# Patient Record
Sex: Male | Born: 1980 | Race: Black or African American | Hispanic: No | Marital: Single | Smoking: Current every day smoker
Health system: Southern US, Community
[De-identification: ages and names within clinical notes are randomized; demographics above are authoritative.]

## PROBLEM LIST (undated history)

## (undated) DIAGNOSIS — M24411 Recurrent dislocation, right shoulder: Secondary | ICD-10-CM

---

## 2011-09-11 ENCOUNTER — Emergency Department (HOSPITAL_COMMUNITY)
Admission: EM | Admit: 2011-09-11 | Discharge: 2011-09-12 | Disposition: A | Discharge status: Home / Self Care | Payer: Self-pay | Attending: Emergency Medicine | Admitting: Emergency Medicine

## 2011-09-11 ENCOUNTER — Encounter (HOSPITAL_COMMUNITY): Payer: Self-pay | Admitting: Adult Health

## 2011-09-11 DIAGNOSIS — Z202 Contact with and (suspected) exposure to infections with a predominantly sexual mode of transmission: Secondary | ICD-10-CM | POA: Insufficient documentation

## 2011-09-11 DIAGNOSIS — F17200 Nicotine dependence, unspecified, uncomplicated: Secondary | ICD-10-CM | POA: Insufficient documentation

## 2011-09-11 NOTE — ED Notes (Signed)
Would like to be checked out for an STD, partner has one.

## 2011-09-12 MED ORDER — METRONIDAZOLE 500 MG PO TABS: 500.0000 mg | ORAL_TABLET | Freq: Two times a day (BID) | ORAL | Status: AC

## 2011-09-12 MED ORDER — AZITHROMYCIN 250 MG PO TABS
1000.0000 mg | ORAL_TABLET | Freq: Once | ORAL | Status: AC
  Administered 2011-09-12: 1000 mg via ORAL
  Filled 2011-09-12: qty 4

## 2011-09-12 MED ORDER — CEFTRIAXONE SODIUM 250 MG IJ SOLR
250.0000 mg | Freq: Once | INTRAMUSCULAR | Status: AC
  Administered 2011-09-12: 250 mg via INTRAMUSCULAR
  Filled 2011-09-12: qty 250

## 2011-09-12 MED ORDER — LIDOCAINE HCL (PF) 1 % IJ SOLN
INTRAMUSCULAR | Status: AC
  Administered 2011-09-12: 2.1 mL
  Filled 2011-09-12: qty 5

## 2011-09-12 NOTE — ED Notes (Signed)
Pt denies any questions or pain upon discharge. 

## 2011-09-13 LAB — GC/CHLAMYDIA PROBE AMP, URINE: Chlamydia, Swab/Urine, PCR: NEGATIVE

## 2011-09-15 NOTE — ED Provider Notes (Signed)
History     CSN: [id number]  Arrival date & time 09/11/11  2127   First MD Initiated Contact with Patient 09/12/11 0026      Chief Complaint  Patient presents with  . V70.1    (Consider location/radiation/quality/duration/timing/severity/associated sxs/prior treatment) HPI Comments: Pt states male partner was recently txed for trich and he wants to be treated. He does not believe that she had any other STIs. He denies any sx - no penile dc or lesions, no scrotal pain or swelling, no abd pain, no urinary sx.  Patient is a 31 y.o. male presenting with STD exposure. The history is provided by the patient.  Exposure to STD This is a new problem. The current episode started 1 to 4 weeks ago.    History reviewed. No pertinent past medical history.  History reviewed. No pertinent past surgical history.  History reviewed. No pertinent family history.  History  Substance Use Topics  . Smoking status: Current Everyday Smoker  . Smokeless tobacco: Not on file  . Alcohol Use: Yes      Review of Systems per HPI  Allergies  Review of patient's allergies indicates no known allergies.  Home Medications   Current Outpatient Rx  Name Route Sig Dispense Refill  . METRONIDAZOLE 500 MG PO TABS Oral Take 1 tablet (500 mg total) by mouth 2 (two) times daily. 14 tablet 0    BP 129/84  Pulse 78  Temp 98.2 F (36.8 C) (Oral)  Resp 18  SpO2 100%  Physical Exam  Nursing note and vitals reviewed. Constitutional: He appears well-developed and well-nourished. No distress.  HENT:  Head: Normocephalic and atraumatic.  Neck: Normal range of motion.  Cardiovascular: Normal rate.   Pulmonary/Chest: Effort normal.  Musculoskeletal: Normal range of motion.  Neurological: He is alert.  Skin: Skin is warm and dry. He is not diaphoretic.  Psychiatric: He has a normal mood and affect.    ED Course  Procedures (including critical care time)   Labs Reviewed  GC/CHLAMYDIA PROBE AMP,  URINE  LAB REPORT - SCANNED   No results found.   1. Exposure to STD       MDM  Pt with exposure to trich from male partner. He denies any sx with this. Urine gc/chlam sent, prophylactic tx ordered. Rx flagyl. Pt aware of importance of abstinence from etoh while taking this as well as abstaining from intercourse until tx complete. Reasons to return discussed.       Grant Fontana, PA-C 09/15/11 1252

## 2011-09-15 NOTE — ED Provider Notes (Signed)
Medical screening examination/treatment/procedure(s) were performed by non-physician practitioner and as supervising physician I was immediately available for consultation/collaboration.   Charles B. Sheldon, MD 09/15/11 2333 

## 2013-10-17 ENCOUNTER — Emergency Department (HOSPITAL_COMMUNITY)
Admission: EM | Admit: 2013-10-17 | Discharge: 2013-10-17 | Disposition: A | Discharge status: Home / Self Care | Payer: No Typology Code available for payment source | Attending: Emergency Medicine | Admitting: Emergency Medicine

## 2013-10-17 ENCOUNTER — Encounter (HOSPITAL_COMMUNITY): Payer: Self-pay | Admitting: Emergency Medicine

## 2013-10-17 ENCOUNTER — Emergency Department (HOSPITAL_COMMUNITY): Payer: No Typology Code available for payment source

## 2013-10-17 DIAGNOSIS — S161XXA Strain of muscle, fascia and tendon at neck level, initial encounter: Secondary | ICD-10-CM

## 2013-10-17 DIAGNOSIS — Y9389 Activity, other specified: Secondary | ICD-10-CM | POA: Insufficient documentation

## 2013-10-17 DIAGNOSIS — IMO0002 Reserved for concepts with insufficient information to code with codable children: Secondary | ICD-10-CM | POA: Insufficient documentation

## 2013-10-17 DIAGNOSIS — S139XXA Sprain of joints and ligaments of unspecified parts of neck, initial encounter: Secondary | ICD-10-CM | POA: Insufficient documentation

## 2013-10-17 DIAGNOSIS — S0993XA Unspecified injury of face, initial encounter: Secondary | ICD-10-CM | POA: Insufficient documentation

## 2013-10-17 DIAGNOSIS — Y92410 Unspecified street and highway as the place of occurrence of the external cause: Secondary | ICD-10-CM | POA: Insufficient documentation

## 2013-10-17 DIAGNOSIS — F17200 Nicotine dependence, unspecified, uncomplicated: Secondary | ICD-10-CM | POA: Insufficient documentation

## 2013-10-17 DIAGNOSIS — M545 Low back pain, unspecified: Secondary | ICD-10-CM

## 2013-10-17 DIAGNOSIS — S199XXA Unspecified injury of neck, initial encounter: Secondary | ICD-10-CM

## 2013-10-17 MED ORDER — HYDROCODONE-ACETAMINOPHEN 5-325 MG PO TABS: 1.0000 | ORAL_TABLET | Freq: Four times a day (QID) | ORAL | Status: DC | PRN

## 2013-10-17 MED ORDER — NAPROXEN 500 MG PO TABS: 500.0000 mg | ORAL_TABLET | Freq: Two times a day (BID) | ORAL | Status: DC
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
## 2013-10-17 NOTE — ED Notes (Signed)
Pt states he has pain in his neck that shoots across his shoulders, down his spine and down the front of both legs when moving.

## 2013-10-17 NOTE — ED Notes (Signed)
Pt off floor at xray

## 2013-10-18 NOTE — ED Provider Notes (Signed)
Medical screening examination/treatment/procedure(s) were performed by non-physician practitioner and as supervising physician I was immediately available for consultation/collaboration.   EKG Interpretation None        Keylan Costabile N Keiland Pickering, DO 10/18/13 0043 

## 2014-05-17 ENCOUNTER — Emergency Department (HOSPITAL_COMMUNITY)
Admission: EM | Admit: 2014-05-17 | Discharge: 2014-05-17 | Disposition: A | Discharge status: Home / Self Care | Payer: Self-pay | Attending: Emergency Medicine | Admitting: Emergency Medicine

## 2014-05-17 ENCOUNTER — Emergency Department (HOSPITAL_COMMUNITY): Payer: Self-pay

## 2014-05-17 ENCOUNTER — Encounter (HOSPITAL_COMMUNITY): Payer: Self-pay | Admitting: *Deleted

## 2014-05-17 DIAGNOSIS — S4991XA Unspecified injury of right shoulder and upper arm, initial encounter: Secondary | ICD-10-CM | POA: Insufficient documentation

## 2014-05-17 DIAGNOSIS — Y999 Unspecified external cause status: Secondary | ICD-10-CM | POA: Insufficient documentation

## 2014-05-17 DIAGNOSIS — X58XXXA Exposure to other specified factors, initial encounter: Secondary | ICD-10-CM | POA: Insufficient documentation

## 2014-05-17 DIAGNOSIS — Y929 Unspecified place or not applicable: Secondary | ICD-10-CM | POA: Insufficient documentation

## 2014-05-17 DIAGNOSIS — Z720 Tobacco use: Secondary | ICD-10-CM | POA: Insufficient documentation

## 2014-05-17 DIAGNOSIS — Y939 Activity, unspecified: Secondary | ICD-10-CM | POA: Insufficient documentation

## 2014-05-17 DIAGNOSIS — M25511 Pain in right shoulder: Secondary | ICD-10-CM

## 2014-05-17 DIAGNOSIS — Z791 Long term (current) use of non-steroidal anti-inflammatories (NSAID): Secondary | ICD-10-CM | POA: Insufficient documentation

## 2014-05-17 MED ORDER — IBUPROFEN 600 MG PO TABS: 600.0000 mg | ORAL_TABLET | Freq: Four times a day (QID) | ORAL | Status: DC | PRN

## 2014-05-17 MED ORDER — HYDROCODONE-ACETAMINOPHEN 5-325 MG PO TABS: 1.0000 | ORAL_TABLET | Freq: Four times a day (QID) | ORAL | Status: DC | PRN

## 2014-05-17 NOTE — ED Provider Notes (Signed)
CSN: [id number]     Arrival date & time 05/17/14  1319 History  This chart was scribed for non-physician practitioner Roxy Horseman, PA-C working with Richardean Canal, MD by Littie Deeds, ED Scribe. This patient was seen in room TR01C/TR01C and the patient's care was started at 3:32 PM.       Chief Complaint  Patient presents with  . Shoulder Pain   The history is provided by the patient. No language interpreter was used.   HPI Comments: Christopher Mcmahon is a 34 y.o. male who presents to the Emergency Department complaining of gradual onset right shoulder pain occasionally radiating down to his wrist that started a few days after he was moving 2 weeks ago. He states he did not feel a pop while he was moving. But has felt occasional pop since. It is worsened with movement. He has used icy hot for his pain. NKDA.   History reviewed. No pertinent past medical history. History reviewed. No pertinent past surgical history. No family history on file. History  Substance Use Topics  . Smoking status: Current Every Day Smoker  . Smokeless tobacco: Not on file  . Alcohol Use: Yes    Review of Systems  Constitutional: Negative for fever.  Musculoskeletal: Positive for arthralgias.      Allergies  Review of patient's allergies indicates no known allergies.  Home Medications   Prior to Admission medications   Medication Sig Start Date End Date Taking? Authorizing Provider  HYDROcodone-acetaminophen (NORCO) 5-325 MG per tablet Take 1 tablet by mouth every 6 (six) hours as needed for moderate pain. 10/17/13   Hope Orlene Och, NP  naproxen (NAPROSYN) 500 MG tablet Take 1 tablet (500 mg total) by mouth 2 (two) times daily. 10/17/13   Hope Orlene Och, NP   BP 115/87 mmHg  Pulse 86  Temp(Src) 97.6 F (36.4 C) (Oral)  Resp 18  Ht  (1.651 m)  Wt 170 lb (77.111 kg)  BMI 28.29 kg/m2  SpO2 99% Physical Exam  Constitutional: He is oriented to person, place, and time. He appears well-developed and  well-nourished. No distress.  HENT:  Head: Normocephalic and atraumatic.  Mouth/Throat: Oropharynx is clear and moist. No oropharyngeal exudate.  Eyes: Pupils are equal, round, and reactive to light.  Neck: Neck supple.  Cardiovascular: Normal rate and intact distal pulses.   Intact distal pulses, brisk capillary refill  Pulmonary/Chest: Effort normal.  Musculoskeletal: He exhibits no edema.  Negative Hawkins-Kennedy, negative empty can, and tenderness of the acromion, range of motion and strength 5/5  Neurological: He is alert and oriented to person, place, and time. No cranial nerve deficit.  Sensation intact  Skin: Skin is warm and dry. No rash noted.  Psychiatric: He has a normal mood and affect. His behavior is normal.  Nursing note and vitals reviewed.   ED Course  Procedures  DIAGNOSTIC STUDIES: Oxygen Saturation is 99% on room air, normal by my interpretation.    COORDINATION OF CARE: 3:35 PM-Discussed treatment plan which includes medications, shoulder sling and orthopedic follow-up with pt at bedside and pt agreed to plan. Recommended to use ice.   Labs Review Labs Reviewed - No data to display  Imaging Review Dg Shoulder Right  05/17/2014   CLINICAL DATA:  Shoulder pain  EXAM: RIGHT SHOULDER - 2+ VIEW  COMPARISON:  None.  FINDINGS: There is no evidence of fracture or dislocation. There is no evidence of arthropathy or other focal bone abnormality. Soft tissues are unremarkable.  IMPRESSION:  Negative.   Electronically Signed   By: Marlan Palauharles  Clark M.D.   On: 05/17/2014 15:13     EKG Interpretation None      MDM   Final diagnoses:  Shoulder pain, right    Patient with right shoulder injury after moving 2 weeks ago. Reports increased pain with moving and occasionally feels a pop.  Plain films are negative. Discharged home with orthopedic follow-up. Will give shoulder sling and pain medicine.  I personally performed the services described in this documentation,  which was scribed in my presence. The recorded information has been reviewed and is accurate.     Roxy HorsemanRobert Zephan Beauchaine, PA-C 05/17/14 1556  Richardean Canalavid H Yao, MD 05/17/14 [phone]

## 2014-05-17 NOTE — ED Notes (Signed)
Pt tates that he was moving 2 weeks ago. Pt states that he has had progressive rt shoulder pain. Pt states that he hears a pop occasionally. Pt noted to have deformity to rt shoulder. Pt able to move arm, pulse and cap refill wnl.

## 2014-05-17 NOTE — Discharge Instructions (Signed)
Acromioclavicular Injuries °The AC (acromioclavicular) joint is the joint in the shoulder where the collarbone (clavicle) meets the shoulder blade (scapula). The part of the shoulder blade connected to the collarbone is called the acromion. Common problems with and treatments for the AC joint are detailed below. °ARTHRITIS °Arthritis occurs when the joint has been injured and the smooth padding between the joints (cartilage) is lost. This is the wear and tear seen in most joints of the body if they have been overused. This causes the joint to produce pain and swelling which is worse with activity.  °AC JOINT SEPARATION °AC joint separation means that the ligaments connecting the acromion of the shoulder blade and collarbone have been damaged, and the two bones no longer line up. AC separations can be anywhere from mild to severe, and are "graded" depending upon which ligaments are torn and how badly they are torn. °· Grade I Injury: the least damage is done, and the AC joint still lines up. °· Grade II Injury: damage to the ligaments which reinforce the AC joint. In a Grade II injury, these ligaments are stretched but not entirely torn. When stressed, the AC joint becomes painful and unstable. °· Grade III Injury: AC and secondary ligaments are completely torn, and the collarbone is no longer attached to the shoulder blade. This results in deformity; a prominence of the end of the clavicle. °AC JOINT FRACTURE °AC joint fracture means that there has been a break in the bones of the AC joint, usually the end of the clavicle. °TREATMENT °TREATMENT OF AC ARTHRITIS °· There is currently no way to replace the cartilage damaged by arthritis. The best way to improve the condition is to decrease the activities which aggravate the problem. Application of ice to the joint helps decrease pain and soreness (inflammation). The use of non-steroidal anti-inflammatory medication is helpful. °· If less conservative measures do not  work, then cortisone shots (injections) may be used. These are anti-inflammatories; they decrease the soreness in the joint and swelling. °· If non-surgical measures fail, surgery may be recommended. The procedure is generally removal of a portion of the end of the clavicle. This is the part of the collarbone closest to your acromion which is stabilized with ligaments to the acromion of the shoulder blade. This surgery may be performed using a tube-like instrument with a light (arthroscope) for looking into a joint. It may also be performed as an open surgery through a small incision by the surgeon. Most patients will have good range of motion within 6 weeks and may return to all activity including sports by 8-12 weeks, barring complications. °TREATMENT OF AN AC SEPARATION °· The initial treatment is to decrease pain. This is best accomplished by immobilizing the arm in a sling and placing an ice pack to the shoulder for 20 to 30 minutes every 2 hours as needed. As the pain starts to subside, it is important to begin moving the fingers, wrist, elbow and eventually the shoulder in order to prevent a stiff or "frozen" shoulder. Instruction on when and how much to move the shoulder will be provided by your caregiver. The length of time needed to regain full motion and function depends on the amount or grade of the injury. Recovery from a Grade I AC separation usually takes 10 to 14 days, whereas a Grade III may take 6 to 8 weeks. °· Grade I and II separations usually do not require surgery. Even Grade III injuries usually allow return to full   activity with few restrictions. Treatment is also based on the activity demands of the injured shoulder. For example, a high level quarterback with an injured throwing arm will receive more aggressive treatment than someone with a desk job who rarely uses his/her arm for strenuous activities. In some cases, a painful lump may persist which could require a later surgery. Surgery  can be very successful, but the benefits must be weighed against the potential risks. °TREATMENT OF AN AC JOINT FRACTURE °Fracture treatment depends on the type of fracture. Sometimes a splint or sling may be all that is required. Other times surgery may be required for repair. This is more frequently the case when the ligaments supporting the clavicle are completely torn. Your caregiver will help you with these decisions and together you can decide what will be the best treatment. °HOME CARE INSTRUCTIONS  °· Apply ice to the injury for 15-20 minutes each hour while awake for 2 days. Put the ice in a plastic bag and place a towel between the bag of ice and skin. °· If a sling has been applied, wear it constantly for as long as directed by your caregiver, even at night. The sling or splint can be removed for bathing or showering or as directed. Be sure to keep the shoulder in the same place as when the sling is on. Do not lift the arm. °· If a figure-of-eight splint has been applied it should be tightened gently by another person every day. Tighten it enough to keep the shoulders held back. Allow enough room to place the index finger between the body and strap. Loosen the splint immediately if there is numbness or tingling in the hands. °· Take over-the-counter or prescription medicines for pain, discomfort or fever as directed by your caregiver. °· If you or your child has received a follow up appointment, it is very important to keep that appointment in order to avoid long term complications, chronic pain or disability. °SEEK MEDICAL CARE IF:  °· The pain is not relieved with medications. °· There is increased swelling or discoloration that continues to get worse rather than better. °· You or your child has been unable to follow up as instructed. °· There is progressive numbness and tingling in the arm, forearm or hand. °SEEK IMMEDIATE MEDICAL CARE IF:  °· The arm is numb, cold or pale. °· There is increasing pain  in the hand, forearm or fingers. °MAKE SURE YOU:  °· Understand these instructions. °· Will watch your condition. °· Will get help right away if you are not doing well or get worse. °Document Released: 10/24/2004 Document Revised: 04/08/2011 Document Reviewed: 04/18/2008 °[employer]® Patient Information ©2015 [employer]. This information is not intended to replace advice given to you by your health care provider. Make sure you discuss any questions you have with your health care provider. ° °

## 2014-08-29 ENCOUNTER — Encounter (HOSPITAL_COMMUNITY): Payer: Self-pay | Admitting: *Deleted

## 2014-08-29 ENCOUNTER — Emergency Department (HOSPITAL_COMMUNITY)
Admission: EM | Admit: 2014-08-29 | Discharge: 2014-08-30 | Disposition: A | Discharge status: Home / Self Care | Payer: Medicaid Other | Attending: Emergency Medicine | Admitting: Emergency Medicine

## 2014-08-29 ENCOUNTER — Emergency Department (HOSPITAL_COMMUNITY): Payer: Medicaid Other

## 2014-08-29 DIAGNOSIS — Y9289 Other specified places as the place of occurrence of the external cause: Secondary | ICD-10-CM | POA: Insufficient documentation

## 2014-08-29 DIAGNOSIS — X58XXXA Exposure to other specified factors, initial encounter: Secondary | ICD-10-CM | POA: Insufficient documentation

## 2014-08-29 DIAGNOSIS — Z791 Long term (current) use of non-steroidal anti-inflammatories (NSAID): Secondary | ICD-10-CM | POA: Insufficient documentation

## 2014-08-29 DIAGNOSIS — Y998 Other external cause status: Secondary | ICD-10-CM | POA: Insufficient documentation

## 2014-08-29 DIAGNOSIS — M25511 Pain in right shoulder: Secondary | ICD-10-CM

## 2014-08-29 DIAGNOSIS — Y9389 Activity, other specified: Secondary | ICD-10-CM | POA: Diagnosis not present

## 2014-08-29 DIAGNOSIS — S4992XA Unspecified injury of left shoulder and upper arm, initial encounter: Secondary | ICD-10-CM | POA: Diagnosis present

## 2014-08-29 NOTE — ED Notes (Signed)
Pt states that he has been having increasing rt shoulder pain. States that last he was here in May he was told he has some torn ligaments. States that orthopedic dr told him to do physical therapy. Pt wants a new orthopedic referral.

## 2014-08-29 NOTE — ED Notes (Signed)
Pt states that he was prescribed oxycodone and ibuprofen last time in ED and it did not help. States that the orthopedist prescribed tramadol which did not help either.

## 2014-08-30 NOTE — Discharge Instructions (Signed)
Acromioclavicular Injuries °The AC (acromioclavicular) joint is the joint in the shoulder where the collarbone (clavicle) meets the shoulder blade (scapula). The part of the shoulder blade connected to the collarbone is called the acromion. Common problems with and treatments for the AC joint are detailed below. °ARTHRITIS °Arthritis occurs when the joint has been injured and the smooth padding between the joints (cartilage) is lost. This is the wear and tear seen in most joints of the body if they have been overused. This causes the joint to produce pain and swelling which is worse with activity.  °AC JOINT SEPARATION °AC joint separation means that the ligaments connecting the acromion of the shoulder blade and collarbone have been damaged, and the two bones no longer line up. AC separations can be anywhere from mild to severe, and are "graded" depending upon which ligaments are torn and how badly they are torn. °· Grade I Injury: the least damage is done, and the AC joint still lines up. °· Grade II Injury: damage to the ligaments which reinforce the AC joint. In a Grade II injury, these ligaments are stretched but not entirely torn. When stressed, the AC joint becomes painful and unstable. °· Grade III Injury: AC and secondary ligaments are completely torn, and the collarbone is no longer attached to the shoulder blade. This results in deformity; a prominence of the end of the clavicle. °AC JOINT FRACTURE °AC joint fracture means that there has been a break in the bones of the AC joint, usually the end of the clavicle. °TREATMENT °TREATMENT OF AC ARTHRITIS °· There is currently no way to replace the cartilage damaged by arthritis. The best way to improve the condition is to decrease the activities which aggravate the problem. Application of ice to the joint helps decrease pain and soreness (inflammation). The use of non-steroidal anti-inflammatory medication is helpful. °· If less conservative measures do not  work, then cortisone shots (injections) may be used. These are anti-inflammatories; they decrease the soreness in the joint and swelling. °· If non-surgical measures fail, surgery may be recommended. The procedure is generally removal of a portion of the end of the clavicle. This is the part of the collarbone closest to your acromion which is stabilized with ligaments to the acromion of the shoulder blade. This surgery may be performed using a tube-like instrument with a light (arthroscope) for looking into a joint. It may also be performed as an open surgery through a small incision by the surgeon. Most patients will have good range of motion within 6 weeks and may return to all activity including sports by 8-12 weeks, barring complications. °TREATMENT OF AN AC SEPARATION °· The initial treatment is to decrease pain. This is best accomplished by immobilizing the arm in a sling and placing an ice pack to the shoulder for 20 to 30 minutes every 2 hours as needed. As the pain starts to subside, it is important to begin moving the fingers, wrist, elbow and eventually the shoulder in order to prevent a stiff or "frozen" shoulder. Instruction on when and how much to move the shoulder will be provided by your caregiver. The length of time needed to regain full motion and function depends on the amount or grade of the injury. Recovery from a Grade I AC separation usually takes 10 to 14 days, whereas a Grade III may take 6 to 8 weeks. °· Grade I and II separations usually do not require surgery. Even Grade III injuries usually allow return to full   activity with few restrictions. Treatment is also based on the activity demands of the injured shoulder. For example, a high level quarterback with an injured throwing arm will receive more aggressive treatment than someone with a desk job who rarely uses his/her arm for strenuous activities. In some cases, a painful lump may persist which could require a later surgery. Surgery  can be very successful, but the benefits must be weighed against the potential risks. TREATMENT OF AN AC JOINT FRACTURE Fracture treatment depends on the type of fracture. Sometimes a splint or sling may be all that is required. Other times surgery may be required for repair. This is more frequently the case when the ligaments supporting the clavicle are completely torn. Your caregiver will help you with these decisions and together you can decide what will be the best treatment. HOME CARE INSTRUCTIONS   Apply ice to the injury for 15-20 minutes each hour while awake for 2 days. Put the ice in a plastic bag and place a towel between the bag of ice and skin.  Take over-the-counter or prescription medicines for pain, discomfort or fever as directed by your caregiver.  If you or your child has received a follow up appointment, it is very important to keep that appointment in order to avoid long term complications, chronic pain or disability. SEEK MEDICAL CARE IF:   The pain is not relieved with medications.  There is increased swelling or discoloration that continues to get worse rather than better.  You or your child has been unable to follow up as instructed.  There is progressive numbness and tingling in the arm, forearm or hand. SEEK IMMEDIATE MEDICAL CARE IF:   The arm is numb, cold or pale.  There is increasing pain in the hand, forearm or fingers. MAKE SURE YOU:   Understand these instructions.  Will watch your condition.  Will get help right away if you are not doing well or get worse. Document Released: 10/24/2004 Document Revised: 04/08/2011 Document Reviewed: 04/18/2008 [facility] Patient Information 2015 [place], [place]. This information is not intended to replace advice given to you by your health care provider. Make sure you discuss any questions you have with your health care provider.

## 2014-08-30 NOTE — ED Provider Notes (Addendum)
CSN: [id number]     Arrival date & time 08/29/14  2251 History   First MD Initiated Contact with Patient 08/29/14 2304     Chief Complaint  Patient presents with  . Shoulder Pain     (Consider location/radiation/quality/duration/timing/severity/associated sxs/prior Treatment) HPI Comments: 34 year old male presents with right shoulder pain. He states that in April of this year, he was moving furniture and later that day began having right shoulder pain. He was evaluated and diagnosed with a ligamentous injury. He was placed in a sling and discharged with orthopedics follow-up. His wife states that he followed up with orthopedics and they told him he needed physical therapy. They were displeased with this plan and have presented today for referral for second opinion. The patient continues to have significant shoulder pain when he moves his arm above his head. He denies any numbness or tingling of his hand. His wife reports that he often has swelling of the top of his shoulder.  Patient is a 34 y.o. male presenting with shoulder pain. The history is provided by the patient and the spouse.  Shoulder Pain   History reviewed. No pertinent past medical history. History reviewed. No pertinent past surgical history. No family history on file. History  Substance Use Topics  . Smoking status: Current Every Day Smoker  . Smokeless tobacco: Not on file  . Alcohol Use: Yes    Review of Systems 10 Systems reviewed and are negative for acute change except as noted in the HPI.    Allergies  Review of patient's allergies indicates no known allergies.  Home Medications   Prior to Admission medications   Medication Sig Start Date End Date Taking? Authorizing Provider  HYDROcodone-acetaminophen (NORCO/VICODIN) 5-325 MG per tablet Take 1-2 tablets by mouth every 6 (six) hours as needed. 05/17/14   Roxy Horseman, PA-C  ibuprofen (ADVIL,MOTRIN) 600 MG tablet Take 1 tablet (600 mg total) by mouth  every 6 (six) hours as needed. 05/17/14   Roxy Horseman, PA-C  naproxen (NAPROSYN) 500 MG tablet Take 1 tablet (500 mg total) by mouth 2 (two) times daily. 10/17/13   Hope Orlene Och, NP   BP 135/82 mmHg  Pulse 72  Temp(Src) 97.6 F (36.4 C) (Oral)  Resp 16  SpO2 100% Physical Exam  Constitutional: He is oriented to person, place, and time. He appears well-developed and well-nourished. No distress.  HENT:  Head: Normocephalic and atraumatic.  Eyes: Conjunctivae are normal. Pupils are equal, round, and reactive to light.  Cardiovascular: Normal rate, regular rhythm and normal heart sounds.   Pulmonary/Chest: Effort normal and breath sounds normal. No respiratory distress.  Musculoskeletal:  Limited abduction of right shoulder secondary to pain, bony prominence of the distal right clavicle near [facility] joint suggestive of previous dislocation  Neurological: He is alert and oriented to person, place, and time.  Skin: Skin is warm and dry.  Psychiatric: He has a normal mood and affect. His behavior is normal.  Nursing note and vitals reviewed.   ED Course  Procedures (including critical care time) Labs Review Labs Reviewed - No data to display  Imaging Review Dg Shoulder Right  08/29/2014   CLINICAL DATA:  Pain at the right shoulder for 4 months, after lifting furniture. Initial encounter.  EXAM: RIGHT SHOULDER - 2+ VIEW  COMPARISON:  Right shoulder radiographs performed 05/17/2014  FINDINGS: There is no evidence of fracture or dislocation. The right humeral head is seated within the glenoid fossa. Minimal degenerative change is noted at the right  acromioclavicular joint. No significant soft tissue abnormalities are seen. The visualized portions of the right lung are clear.  IMPRESSION: No evidence of fracture or dislocation.   Electronically Signed   By: Roanna Raider M.D.   On: 08/29/2014 23:32     EKG Interpretation None      MDM   Final diagnoses:  Shoulder pain, acute, right    34 year old male with ongoing right shoulder pain after shoulder injury several months ago. They present for a second opinion. No evidence of fracture or dislocation on x-ray. Physical exam consistent with AC joint separation. I have instructed them on supportive care and provided them with contact information for orthopedic follow-up.  Laurence Spates, MD 08/30/14 1610  Laurence Spates, MD 08/30/14 [phone]

## 2014-10-05 ENCOUNTER — Emergency Department (HOSPITAL_COMMUNITY)
Admission: EM | Admit: 2014-10-05 | Discharge: 2014-10-05 | Disposition: A | Discharge status: Home / Self Care | Payer: Medicaid Other | Attending: Emergency Medicine | Admitting: Emergency Medicine

## 2014-10-05 ENCOUNTER — Encounter (HOSPITAL_COMMUNITY): Payer: Self-pay | Admitting: *Deleted

## 2014-10-05 DIAGNOSIS — Y939 Activity, unspecified: Secondary | ICD-10-CM | POA: Insufficient documentation

## 2014-10-05 DIAGNOSIS — Y929 Unspecified place or not applicable: Secondary | ICD-10-CM | POA: Diagnosis not present

## 2014-10-05 DIAGNOSIS — S93402A Sprain of unspecified ligament of left ankle, initial encounter: Secondary | ICD-10-CM | POA: Diagnosis not present

## 2014-10-05 DIAGNOSIS — Z720 Tobacco use: Secondary | ICD-10-CM | POA: Insufficient documentation

## 2014-10-05 DIAGNOSIS — Y998 Other external cause status: Secondary | ICD-10-CM | POA: Insufficient documentation

## 2014-10-05 DIAGNOSIS — S9002XA Contusion of left ankle, initial encounter: Secondary | ICD-10-CM | POA: Diagnosis not present

## 2014-10-05 DIAGNOSIS — S99912A Unspecified injury of left ankle, initial encounter: Secondary | ICD-10-CM | POA: Diagnosis present

## 2014-10-05 DIAGNOSIS — X58XXXA Exposure to other specified factors, initial encounter: Secondary | ICD-10-CM | POA: Insufficient documentation

## 2014-10-05 LAB — CBG MONITORING, ED: Glucose-Capillary: 100 mg/dL — ABNORMAL HIGH (ref 65–99)

## 2014-10-05 MED ORDER — NAPROXEN 375 MG PO TABS: 375.0000 mg | ORAL_TABLET | Freq: Two times a day (BID) | ORAL | Status: DC

## 2014-10-05 MED ORDER — NAPROXEN 250 MG PO TABS
500.0000 mg | ORAL_TABLET | Freq: Once | ORAL | Status: AC
  Administered 2014-10-05: 500 mg via ORAL
  Filled 2014-10-05: qty 2

## 2014-10-05 MED ORDER — NAPROXEN 500 MG PO TABS: 500.0000 mg | ORAL_TABLET | Freq: Two times a day (BID) | ORAL | Status: DC

## 2014-10-05 NOTE — Discharge Instructions (Signed)

## 2014-10-05 NOTE — ED Provider Notes (Signed)
CSN: [id number]     Arrival date & time 10/05/14  1914 History  This chart was scribed for Christopher Pel, PA-C, working with Marily Memos, MD by Elon Spanner, ED Scribe. This patient was seen in room TR08C/TR08C and the patient's care was started at 8:27 PM.   Chief Complaint  Patient presents with  . Edema    The history is provided by the patient. No language interpreter was used.   HPI Comments: Christopher Mcmahon is a 34 y.o. male who presents to the Emergency Department complaining of worsening, moderately painful, bilateral, left > right,  leg swelling with injury onset 6 days ago, improved moderately by elevation/soaking/ace wrap (no other medications tried).  Pt reports he started a new job 8 days ago that requires him to stand the entire time with the exception of short breaks. This is new for him physically.  At this job, he typically wears tennis shoes, ankle socks, and pants or shorts.  Patient is able to ambulate with altered gait due to pain . He denies pain up into his calf, no CP, SOB. Denies hx of blood clots.  History reviewed. No pertinent past medical history. History reviewed. No pertinent past surgical history. History reviewed. No pertinent family history. Social History  Substance Use Topics  . Smoking status: Current Every Day Smoker  . Smokeless tobacco: None  . Alcohol Use: Yes    Review of Systems A complete 10 system review of systems was obtained and all systems are negative except as noted in the HPI and PMH.   Allergies  Review of patient's allergies indicates no known allergies.  Home Medications   Prior to Admission medications   Medication Sig Start Date End Date Taking? Authorizing Provider  HYDROcodone-acetaminophen (NORCO/VICODIN) 5-325 MG per tablet Take 1-2 tablets by mouth every 6 (six) hours as needed. 05/17/14   Roxy Horseman, PA-C  ibuprofen (ADVIL,MOTRIN) 600 MG tablet Take 1 tablet (600 mg total) by mouth every 6 (six) hours as needed.  05/17/14   Roxy Horseman, PA-C  naproxen (NAPROSYN) 375 MG tablet Take 1 tablet (375 mg total) by mouth 2 (two) times daily. 10/05/14   Alzena Gerber Neva Seat, PA-C  naproxen (NAPROSYN) 500 MG tablet Take 1 tablet (500 mg total) by mouth 2 (two) times daily. 10/05/14   Derk Doubek Neva Seat, PA-C   BP 132/97 mmHg  Pulse 96  Temp(Src) 98.4 F (36.9 C) (Oral)  Resp 16  SpO2 99% Physical Exam  Constitutional: He is oriented to person, place, and time. He appears well-developed and well-nourished. No distress.  HENT:  Head: Normocephalic and atraumatic.  Eyes: Conjunctivae and EOM are normal.  Neck: Neck supple. No tracheal deviation present.  Cardiovascular: Normal rate.   Pulmonary/Chest: Effort normal. No respiratory distress.  Musculoskeletal:       Right ankle: Normal. He exhibits no swelling. No tenderness.       Left ankle: He exhibits decreased range of motion (due to pain), swelling (lateral malleoli) and ecchymosis. He exhibits no deformity. Tenderness. Lateral malleolus tenderness found. Achilles tendon normal.       Feet:  Neurological: He is alert and oriented to person, place, and time.  Skin: Skin is warm and dry.  Psychiatric: He has a normal mood and affect. His behavior is normal.  Nursing note and vitals reviewed.   ED Course  Procedures (including critical care time)  DIAGNOSTIC STUDIES: Oxygen Saturation is 99% on RA, normal by my interpretation.    COORDINATION OF CARE:  8:33 PM Patient  advised to use compression hose, anti-inflammatory, and splint.  Patient acknowledges and agrees with plan.  Patient advised of return precautions.    Labs Review Labs Reviewed  CBG MONITORING, ED - Abnormal; Notable for the following:    Glucose-Capillary 100 (*)    All other components within normal limits    Imaging Review No results found. I have personally reviewed and evaluated these images and lab results as part of my medical decision-making.   EKG Interpretation None       MDM   Final diagnoses:  Ankle sprain, left, initial encounter   Patient has ecchymosis and swelling to left lateral ankle. Consistent with a sprain, in his cause most likely due to repetitive use and being on his feet more. Denies trauma. The swelling does not extend past the ankle into the calf or upper leg. No URI symptoms.  Referral to Ortho. Given strict return precautions.  Medications  naproxen (NAPROSYN) tablet 500 mg (not administered)    34 y.o.[name]'s evaluation in the Emergency Department is complete. It has been determined that no acute conditions requiring further emergency intervention are present at this time. The patient/guardian have been advised of the diagnosis and plan. We have discussed signs and symptoms that warrant return to the ED, such as changes or worsening in symptoms.  Vital signs are stable at discharge. Filed Vitals:   10/05/14 1955  BP: 132/97  Pulse: 96  Temp: 98.4 F (36.9 C)  Resp: 16    Patient/guardian has voiced understanding and agreed to follow-up with the PCP or specialist.   I personally performed the services described in this documentation, which was scribed in my presence. The recorded information has been reviewed and is accurate.    Christopher Pel, PA-C 10/05/14 2043  Marily Memos, MD 10/05/14 [phone]

## 2014-10-05 NOTE — ED Notes (Signed)
Pt reports bilateral feet swelling and pain for a week. Pt denies any recent injury to feet. Pt reports working on his feet a lot.

## 2014-10-05 NOTE — ED Notes (Signed)
Patient left at this time with all belongings. 

## 2015-01-26 ENCOUNTER — Emergency Department (HOSPITAL_COMMUNITY): Payer: Medicaid Other

## 2015-01-26 ENCOUNTER — Encounter (HOSPITAL_COMMUNITY): Payer: Self-pay | Admitting: Cardiology

## 2015-01-26 ENCOUNTER — Emergency Department (HOSPITAL_COMMUNITY)
Admission: EM | Admit: 2015-01-26 | Discharge: 2015-01-26 | Disposition: A | Discharge status: Home / Self Care | Payer: Medicaid Other | Attending: Emergency Medicine | Admitting: Emergency Medicine

## 2015-01-26 DIAGNOSIS — R000 Tachycardia, unspecified: Secondary | ICD-10-CM | POA: Insufficient documentation

## 2015-01-26 DIAGNOSIS — J159 Unspecified bacterial pneumonia: Secondary | ICD-10-CM | POA: Diagnosis not present

## 2015-01-26 DIAGNOSIS — F17210 Nicotine dependence, cigarettes, uncomplicated: Secondary | ICD-10-CM | POA: Diagnosis not present

## 2015-01-26 DIAGNOSIS — R0602 Shortness of breath: Secondary | ICD-10-CM | POA: Diagnosis present

## 2015-01-26 DIAGNOSIS — J189 Pneumonia, unspecified organism: Secondary | ICD-10-CM

## 2015-01-26 LAB — COMPREHENSIVE METABOLIC PANEL
ALBUMIN: 4 g/dL (ref 3.5–5.0)
ALT: 28 U/L (ref 17–63)
AST: 21 U/L (ref 15–41)
Alkaline Phosphatase: 99 U/L (ref 38–126)
Anion gap: 9 (ref 5–15)
BILIRUBIN TOTAL: 0.2 mg/dL — AB (ref 0.3–1.2)
BUN: 12 mg/dL (ref 6–20)
CHLORIDE: 105 mmol/L (ref 101–111)
CO2: 24 mmol/L (ref 22–32)
Calcium: 9 mg/dL (ref 8.9–10.3)
Creatinine, Ser: 0.85 mg/dL (ref 0.61–1.24)
GFR calc Af Amer: >60 mL/min (ref >60)
GFR calc non Af Amer: >60 mL/min (ref >60)
GLUCOSE: 101 mg/dL — AB (ref 65–99)
POTASSIUM: 3.8 mmol/L (ref 3.5–5.1)
Sodium: 138 mmol/L (ref 135–145)
Total Protein: 7.3 g/dL (ref 6.5–8.1)

## 2015-01-26 LAB — CBC WITH DIFFERENTIAL/PLATELET
Basophils Absolute: 0 10*3/uL (ref 0.0–0.1)
Basophils Relative: 0 %
Eosinophils Absolute: 0.1 10*3/uL (ref 0.0–0.7)
Eosinophils Relative: 1 %
HEMATOCRIT: 40 % (ref 39.0–52.0)
Hemoglobin: 13.1 g/dL (ref 13.0–17.0)
LYMPHS PCT: 15 %
Lymphs Abs: 2.2 10*3/uL (ref 0.7–4.0)
MCH: 29.8 pg (ref 26.0–34.0)
MCHC: 32.8 g/dL (ref 30.0–36.0)
MCV: 91.1 fL (ref 78.0–100.0)
MONO ABS: 0.8 10*3/uL (ref 0.1–1.0)
MONOS PCT: 6 %
NEUTROS ABS: 11 10*3/uL — AB (ref 1.7–7.7)
Neutrophils Relative %: 78 %
Platelets: 256 10*3/uL (ref 150–400)
RBC: 4.39 MIL/uL (ref 4.22–5.81)
RDW: 13.4 % (ref 11.5–15.5)
WBC: 14.1 10*3/uL — ABNORMAL HIGH (ref 4.0–10.5)

## 2015-01-26 LAB — I-STAT CG4 LACTIC ACID, ED
Lactic Acid, Venous: 1.5 mmol/L (ref 0.5–2.0)
Lactic Acid, Venous: 1.85 mmol/L (ref 0.5–2.0)

## 2015-01-26 MED ORDER — LEVOFLOXACIN IN D5W 750 MG/150ML IV SOLN
750.0000 mg | Freq: Once | INTRAVENOUS | Status: AC
  Administered 2015-01-26: 750 mg via INTRAVENOUS
  Filled 2015-01-26: qty 150

## 2015-01-26 MED ORDER — IBUPROFEN 800 MG PO TABS: 800.0000 mg | ORAL_TABLET | Freq: Three times a day (TID) | ORAL | Status: DC

## 2015-01-26 MED ORDER — ACETAMINOPHEN 325 MG PO TABS
ORAL_TABLET | ORAL | Status: AC
  Filled 2015-01-26: qty 2

## 2015-01-26 MED ORDER — KETOROLAC TROMETHAMINE 30 MG/ML IJ SOLN
30.0000 mg | Freq: Once | INTRAMUSCULAR | Status: AC
  Administered 2015-01-26: 30 mg via INTRAVENOUS
  Filled 2015-01-26: qty 1

## 2015-01-26 MED ORDER — LEVOFLOXACIN 750 MG PO TABS: 750.0000 mg | ORAL_TABLET | Freq: Every day | ORAL | Status: DC

## 2015-01-26 MED ORDER — SODIUM CHLORIDE 0.9 % IV BOLUS (SEPSIS)
1000.0000 mL | Freq: Once | INTRAVENOUS | Status: AC
Start: 2015-01-26 — End: 2015-01-26
  Administered 2015-01-26: 1000 mL via INTRAVENOUS

## 2015-01-26 MED ORDER — ACETAMINOPHEN 500 MG PO TABS
1000.0000 mg | ORAL_TABLET | Freq: Once | ORAL | Status: AC
  Administered 2015-01-26: 1000 mg via ORAL
  Filled 2015-01-26: qty 2

## 2015-01-26 MED ORDER — ACETAMINOPHEN 325 MG PO TABS
650.0000 mg | ORAL_TABLET | Freq: Once | ORAL | Status: AC | PRN
  Administered 2015-01-26: 650 mg via ORAL

## 2015-01-26 NOTE — Discharge Instructions (Signed)
Stay hydrated.   Take tylenol 500 mg every 4 hrs and motrin 800 mg every 6 hrs for fever.   Take levaquin for a week.   See your doctor.   Return to ER if you have fever for a week, vomiting, dehydration, trouble breathing.

## 2015-01-26 NOTE — ED Provider Notes (Signed)
CSN: [id number]     Arrival date & time 01/26/15  1444 History   First MD Initiated Contact with Patient 01/26/15 1909     Chief Complaint  Patient presents with  . Shortness of Breath  . Cough  . Fatigue     (Consider location/radiation/quality/duration/timing/severity/associated sxs/prior Treatment) The history is provided by the patient.  Christopher Mcmahon is a 34 y.o. male here with cough, fever. Patient has been having cough and congestion and fatigue the last several days. States that multiple family members are also sick as well. He's been having productive cough with yellow sputum. Has subjective fever and chills as well. Denies any nausea or vomiting. He is otherwise healthy. He does smoke.    History reviewed. No pertinent past medical history. History reviewed. No pertinent past surgical history. History reviewed. No pertinent family history. Social History  Substance Use Topics  . Smoking status: Current Every Day Smoker    Types: Cigarettes  . Smokeless tobacco: None  . Alcohol Use: Yes    Review of Systems  Constitutional: Positive for fever.  Respiratory: Positive for cough and shortness of breath.   All other systems reviewed and are negative.     Allergies  Review of patient's allergies indicates no known allergies.  Home Medications   Prior to Admission medications   Not on File   BP 122/76 mmHg  Pulse 111  Temp(Src) 100.8 F (38.2 C) (Rectal)  Resp 22  Ht 5\' 5"  (1.651 m)  Wt 165 lb (74.844 kg)  BMI 27.46 kg/m2  SpO2 98% Physical Exam  Constitutional: He is oriented to person, place, and time. He appears well-developed and well-nourished.  HENT:  Head: Normocephalic.  MM slightly dry   Eyes: Conjunctivae are normal. Pupils are equal, round, and reactive to light.  Neck: Normal range of motion. Neck supple.  Cardiovascular: Regular rhythm and normal heart sounds.   Tachycardic   Pulmonary/Chest: Effort normal.  Crackles r base   Abdominal:  Soft. Bowel sounds are normal. He exhibits no distension. There is no tenderness. There is no rebound.  Musculoskeletal: Normal range of motion. He exhibits no edema or tenderness.  Neurological: He is alert and oriented to person, place, and time. No cranial nerve deficit. Coordination normal.  Skin: Skin is warm and dry.  Psychiatric: He has a normal mood and affect. His behavior is normal. Judgment and thought content normal.  Nursing note and vitals reviewed.   ED Course  Procedures (including critical care time) Labs Review Labs Reviewed  COMPREHENSIVE METABOLIC PANEL - Abnormal; Notable for the following:    Glucose, Bld 101 (*)    Total Bilirubin 0.2 (*)    All other components within normal limits  CBC WITH DIFFERENTIAL/PLATELET - Abnormal; Notable for the following:    WBC 14.1 (*)    Neutro Abs 11.0 (*)    All other components within normal limits  I-STAT CG4 LACTIC ACID, ED  I-STAT CG4 LACTIC ACID, ED    Imaging Review Dg Chest 2 View  01/26/2015  CLINICAL DATA:  Initial encounter for productive cough starting yesterday with body aches and chest pain. EXAM: CHEST  2 VIEW COMPARISON:  None. FINDINGS: Two views study shows focal airspace disease with air bronchograms at the left base, suggesting pneumonia. There may be some associated atelectasis in the left lower lung. Streaky subsegmental atelectasis or scarring also noted at the right base. The cardiopericardial silhouette is within normal limits for size. Imaged bony structures of the thorax are  intact. IMPRESSION: Retrocardiac left basilar pneumonia. Given the somewhat atypical appearance and apparent associated volume loss/ atelectasis, followup is recommended to ensure resolution. Electronically Signed   By: Kennith Center M.D.   On: 01/26/2015 16:34   I have personally reviewed and evaluated these images and lab results as part of my medical decision-making.   EKG Interpretation None      MDM   Final diagnoses:   None    Christopher Mcmahon is a 34 y.o. male here with fever, cough. Patient tachy and febrile in the ED. Concerned for sepsis likely from viral vs pneumonia. Will get CBC, CMP, lactate, CXR. Will hydrate and give tylenol, motrin and reassess.   9:50 PM CXR showed pneumonia. Fever improved, tachycardia improved. WBC 14, nl lactate. Given levaquin. Never hypoxic. Doesn't meet criteria for admission for pneumonia. Will dc home with levaquin. Counseled patient regarding tendon ruptures.     Richardean Canal, MD 01/26/15 2151

## 2015-01-26 NOTE — ED Notes (Signed)
Pt reports a cough congestion and fatigue for the past couple of days. States everyone in the home has been sick aslo.

## 2015-05-09 ENCOUNTER — Ambulatory Visit (HOSPITAL_COMMUNITY)
Admission: EM | Admit: 2015-05-09 | Discharge: 2015-05-09 | Disposition: A | Discharge status: Home / Self Care | Payer: Medicaid Other | Attending: Family Medicine | Admitting: Family Medicine

## 2015-05-09 ENCOUNTER — Encounter (HOSPITAL_COMMUNITY): Payer: Self-pay | Admitting: Emergency Medicine

## 2015-05-09 DIAGNOSIS — M25511 Pain in right shoulder: Secondary | ICD-10-CM

## 2015-05-09 MED ORDER — DICLOFENAC SODIUM 50 MG PO TBEC: 50.0000 mg | DELAYED_RELEASE_TABLET | Freq: Two times a day (BID) | ORAL | Status: DC

## 2015-05-09 MED ORDER — METHOCARBAMOL 500 MG PO TABS: 500.0000 mg | ORAL_TABLET | Freq: Four times a day (QID) | ORAL | Status: DC

## 2015-05-09 MED ORDER — IBUPROFEN 800 MG PO TABS: 800.0000 mg | ORAL_TABLET | Freq: Three times a day (TID) | ORAL | Status: DC

## 2015-05-09 NOTE — ED Provider Notes (Signed)
CSN: [id number]     Arrival date & time 05/09/15  1705 History   First MD Initiated Contact with Patient 05/09/15 1840     Chief Complaint  Patient presents with  . Shoulder Pain   (Consider location/radiation/quality/duration/timing/severity/associated sxs/prior Treatment) Patient is a 35 y.o. male presenting with shoulder pain. The history is provided by the patient. No language interpreter was used.  Shoulder Pain Location:  Clavicle and shoulder Time since incident:  12 months Injury: yes   Shoulder location:  R shoulder Pain details:    Quality:  Aching   Radiates to:  Does not radiate   Severity:  Severe   Onset quality:  Gradual   Duration:  12 months   Timing:  Constant Chronicity:  Recurrent Handedness:  Right-handed Dislocation: no   Foreign body present:  No foreign bodies Prior injury to area:  No Relieved by:  Nothing Worsened by:  Nothing tried Ineffective treatments:  None tried Associated symptoms: no back pain     History reviewed. No pertinent past medical history. History reviewed. No pertinent past surgical history. No family history on file. Social History  Substance Use Topics  . Smoking status: Current Every Day Smoker    Types: Cigarettes  . Smokeless tobacco: None  . Alcohol Use: Yes    Review of Systems  Musculoskeletal: Negative for back pain.  All other systems reviewed and are negative.   Allergies  Review of patient's allergies indicates no known allergies.  Home Medications   Prior to Admission medications   Medication Sig Start Date End Date Taking? Authorizing Provider  diclofenac (VOLTAREN) 50 MG EC tablet Take 1 tablet (50 mg total) by mouth 2 (two) times daily. 05/09/15   Elson AreasLeslie K Sofia, PA-C  diclofenac (VOLTAREN) 50 MG EC tablet Take 1 tablet (50 mg total) by mouth 2 (two) times daily. 05/09/15   Elson AreasLeslie K Sofia, PA-C  ibuprofen (ADVIL,MOTRIN) 800 MG tablet Take 1 tablet (800 mg total) by mouth 3 (three) times daily. 05/09/15    Elson AreasLeslie K Sofia, PA-C  levofloxacin (LEVAQUIN) 750 MG tablet Take 1 tablet (750 mg total) by mouth daily. X 7 days 01/26/15   Richardean Canalavid H Yao, MD  methocarbamol (ROBAXIN) 500 MG tablet Take 1 tablet (500 mg total) by mouth 4 (four) times daily. 05/09/15   Elson AreasLeslie K Sofia, PA-C   Meds Ordered and Administered this Visit  Medications - No data to display  BP 128/86 mmHg  Pulse 82  Temp(Src) 97.6 F (36.4 C) (Oral)  SpO2 100% No data found.   Physical Exam  Constitutional: He is oriented to person, place, and time. He appears well-developed and well-nourished.  HENT:  Head: Normocephalic.  Eyes: EOM are normal.  Neck: Normal range of motion.  Pulmonary/Chest: Effort normal.  Abdominal: He exhibits no distension.  Musculoskeletal:  Limit range of motion right shoulder,  Pain with movement.  No deformity nv and ns intact  Neurological: He is alert and oriented to person, place, and time.  Psychiatric: He has a normal mood and affect.  Nursing note and vitals reviewed.   ED Course  Procedures (including critical care time)  Labs Review Labs Reviewed - No data to display  Imaging Review No results found.   Visual Acuity Review  Right Eye Distance:   Left Eye Distance:   Bilateral Distance:    Right Eye Near:   Left Eye Near:    Bilateral Near:         MDM   1.  Shoulder pain, right    Meds ordered this encounter  Medications  . DISCONTD: methocarbamol (ROBAXIN) 500 MG tablet    Sig: Take 1 tablet (500 mg total) by mouth 4 (four) times daily.    Dispense:  20 tablet    Refill:  0    Order Specific Question:  Supervising Provider    Answer:  Linna Hoff [phone]  . diclofenac (VOLTAREN) 50 MG EC tablet    Sig: Take 1 tablet (50 mg total) by mouth 2 (two) times daily.    Dispense:  20 tablet    Refill:  0    Order Specific Question:  Supervising Provider    Answer:  Linna Hoff [phone]  . methocarbamol (ROBAXIN) 500 MG tablet    Sig: Take 1 tablet (500 mg  total) by mouth 4 (four) times daily.    Dispense:  20 tablet    Refill:  0    Order Specific Question:  Supervising Provider    Answer:  Linna Hoff [phone]  . ibuprofen (ADVIL,MOTRIN) 800 MG tablet    Sig: Take 1 tablet (800 mg total) by mouth 3 (three) times daily.    Dispense:  21 tablet    Refill:  0    Order Specific Question:  Supervising Provider    Answer:  Linna Hoff [phone]  . diclofenac (VOLTAREN) 50 MG EC tablet    Sig: Take 1 tablet (50 mg total) by mouth 2 (two) times daily.    Dispense:  20 tablet    Refill:  0    Order Specific Question:  Supervising Provider    Answer:  Linna Hoff [phone]  An After Visit Summary was printed and given to the patient.    Lonia Skinner Preston, PA-C 05/09/15 1933

## 2015-05-09 NOTE — ED Notes (Signed)
See PA notation

## 2015-05-09 NOTE — Discharge Instructions (Signed)
Adhesive Capsulitis Adhesive capsulitis is inflammation of the tendons and ligaments that surround the shoulder joint (shoulder capsule). This condition causes the shoulder to become stiff and painful to move. Adhesive capsulitis is also called frozen shoulder. CAUSES This condition may be caused by:  An injury to the shoulder joint.  Straining the shoulder.  Not moving the shoulder for a period of time. This can happen if your arm was injured or in a sling.  Long-standing health problems, such as:  Diabetes.  Thyroid problems.  Heart disease.  Stroke.  Rheumatoid arthritis.  Lung disease. In some cases, the cause may not be known. RISK FACTORS This condition is more likely to develop in:  Women.  People who are older than 36 years of age. SYMPTOMS Symptoms of this condition include:  Pain in the shoulder when moving the arm. There may also be pain when parts of the shoulder are touched. The pain is worse at night or when at rest.  Soreness or aching in the shoulder.  Inability to move the shoulder normally.  Muscle spasms. DIAGNOSIS This condition is diagnosed with a physical exam and imaging tests, such as an X-ray or MRI. TREATMENT This condition may be treated with:  Treatment of the underlying cause or condition.  Physical therapy. This involves performing exercises to get the shoulder moving again.  Medicine. Medicine may be given to relieve pain, inflammation, or muscle spasms.  Steroid injections into the shoulder joint.  Shoulder manipulation. This is a procedure to move the shoulder into another position. It is done after you are given a medicine to make you fall asleep (general anesthetic). The joint may also be injected with salt water at high pressure to break down scarring.  Surgery. This may be done in severe cases when other treatments have failed. Although most people recover completely from adhesive capsulitis, some may not regain the full  movement of the shoulder. HOME CARE INSTRUCTIONS  Take over-the-counter and prescription medicines only as told by your health care provider.  If you are being treated with physical therapy, follow instructions from your physical therapist.  Avoid exercises that put a lot of demand on your shoulder, such as throwing. These exercises can make pain worse.  If directed, apply ice to the injured area:  Put ice in a plastic bag.  Place a towel between your skin and the bag.  Leave the ice on for 20 minutes, 2-3 times per day. SEEK MEDICAL CARE IF:  You develop new symptoms.  Your symptoms get worse.   This information is not intended to replace advice given to you by your health care provider. Make sure you discuss any questions you have with your health care provider.   Document Released: 11/11/2008 Document Revised: 10/05/2014 Document Reviewed: 05/09/2014 Elsevier Interactive Patient Education 2016 [employer]  

## 2016-01-08 ENCOUNTER — Emergency Department (HOSPITAL_COMMUNITY)
Admission: EM | Admit: 2016-01-08 | Discharge: 2016-01-08 | Disposition: A | Discharge status: Home / Self Care | Payer: Medicaid Other | Attending: Emergency Medicine | Admitting: Emergency Medicine

## 2016-01-08 ENCOUNTER — Encounter (HOSPITAL_COMMUNITY): Payer: Self-pay | Admitting: *Deleted

## 2016-01-08 DIAGNOSIS — Z79899 Other long term (current) drug therapy: Secondary | ICD-10-CM | POA: Insufficient documentation

## 2016-01-08 DIAGNOSIS — J309 Allergic rhinitis, unspecified: Secondary | ICD-10-CM | POA: Insufficient documentation

## 2016-01-08 DIAGNOSIS — H9202 Otalgia, left ear: Secondary | ICD-10-CM | POA: Diagnosis not present

## 2016-01-08 DIAGNOSIS — F17290 Nicotine dependence, other tobacco product, uncomplicated: Secondary | ICD-10-CM | POA: Insufficient documentation

## 2016-01-08 DIAGNOSIS — F17210 Nicotine dependence, cigarettes, uncomplicated: Secondary | ICD-10-CM | POA: Diagnosis not present

## 2016-01-08 DIAGNOSIS — R067 Sneezing: Secondary | ICD-10-CM | POA: Diagnosis present

## 2016-01-08 MED ORDER — CETIRIZINE-PSEUDOEPHEDRINE ER 5-120 MG PO TB12: 1.0000 | ORAL_TABLET | Freq: Every day | ORAL | 0 refills | Status: DC

## 2016-01-08 NOTE — ED Provider Notes (Signed)
[facility]-EMERGENCY DEPT Provider Note   CSN: [id number] Arrival date & time: 01/08/16  1419  By signing my name below, I, Javier Dockerobert Ryan Halas, attest that this documentation has been prepared under the direction and in the presence of Terance HartKelly Adri Schloss, PA-C. Electronically Signed: Javier Dockerobert Ryan Halas, ER Scribe. 09/09/2015. 2:39 PM.  History   Chief Complaint Chief Complaint  Patient presents with  . Otalgia   HPI  HPI Comments: Christopher Mcmahon is a 35 y.o. male who presents to the Emergency Department complaining of intermittent bilateral ear pain for the last month. The left hurts more than the right. He feels like he has water in his ears. He endorses intermittent rhinorrhea, intermittently productive cough, sneezing, and sinus congestion for the last month. He has a PMHx of seasonal allergies but is not taking any allergy medicine. He took an abx pill last night that his mom had left over from when she had a URI but unsure of the name. Denies fever, chills, headache, ear drainage, sore throat, abdominal pain, N/V   History reviewed. No pertinent past medical history.  There are no active problems to display for this patient.   History reviewed. No pertinent surgical history.   Home Medications    Prior to Admission medications   Medication Sig Start Date End Date Taking? Authorizing Provider  diclofenac (VOLTAREN) 50 MG EC tablet Take 1 tablet (50 mg total) by mouth 2 (two) times daily. 05/09/15   Elson AreasLeslie K Sofia, PA-C  diclofenac (VOLTAREN) 50 MG EC tablet Take 1 tablet (50 mg total) by mouth 2 (two) times daily. 05/09/15   Elson AreasLeslie K Sofia, PA-C  ibuprofen (ADVIL,MOTRIN) 800 MG tablet Take 1 tablet (800 mg total) by mouth 3 (three) times daily. 05/09/15   Elson AreasLeslie K Sofia, PA-C  levofloxacin (LEVAQUIN) 750 MG tablet Take 1 tablet (750 mg total) by mouth daily. X 7 days 01/26/15   Charlynne Panderavid Hsienta Yao, MD  methocarbamol (ROBAXIN) 500 MG tablet Take 1 tablet (500 mg total) by mouth 4 (four) times daily.  05/09/15   Elson AreasLeslie K Sofia, PA-C    Family History No family history on file.  Social History Social History  Substance Use Topics  . Smoking status: Current Every Day Smoker    Types: Cigarettes, Cigars  . Smokeless tobacco: Never Used  . Alcohol use Yes     Comment: occ     Allergies   Patient has no known allergies.   Review of Systems Review of Systems  Constitutional: Negative for chills and fever.  HENT: Positive for congestion, ear pain, postnasal drip, rhinorrhea and sneezing. Negative for ear discharge, sinus pressure and sore throat.   Respiratory: Positive for cough. Negative for shortness of breath and wheezing.   Gastrointestinal: Negative for nausea and vomiting.  All other systems reviewed and are negative.    Physical Exam Updated Vital Signs BP 147/91 (BP Location: Right Arm)   Pulse 88   Temp 97.7 F (36.5 C) (Oral)   Resp 18   Wt 165 lb (74.8 kg)   SpO2 100%   BMI 27.46 kg/m   Physical Exam  Constitutional: He is oriented to person, place, and time. He appears well-developed and well-nourished. No distress.  HENT:  Head: Normocephalic and atraumatic.  Right Ear: Hearing, tympanic membrane, external ear and ear canal normal.  Left Ear: Hearing, external ear and ear canal normal. Tympanic membrane is erythematous (mild erythema surrounding TM).  Nose: Mucosal edema and rhinorrhea present. Right sinus exhibits no maxillary sinus tenderness  and no frontal sinus tenderness. Left sinus exhibits no maxillary sinus tenderness and no frontal sinus tenderness.  Eyes: Pupils are equal, round, and reactive to light.  Neck: Neck supple.  Cardiovascular: Normal rate.   Pulmonary/Chest: Effort normal. No respiratory distress.  Musculoskeletal: Normal range of motion.  Neurological: He is alert and oriented to person, place, and time. Coordination normal.  Skin: Skin is warm and dry. He is not diaphoretic.  Psychiatric: He has a normal mood and affect. His  behavior is normal.  Nursing note and vitals reviewed.    ED Treatments / Results  Labs (all labs ordered are listed, but only abnormal results are displayed) Labs Reviewed - No data to display  EKG  EKG Interpretation None       Radiology No results found.  Procedures Procedures (including critical care time)  Medications Ordered in ED Medications - No data to display   Initial Impression / Assessment and Plan / ED Course  I have reviewed the triage vital signs and the nursing notes.  Pertinent labs & imaging results that were available during my care of the patient were reviewed by me and considered in my medical decision making (see chart for details).  Clinical Course    35 year old male presents with symptoms consistent with allergies. Will try H2 blocker with decongestant. Abx not indicated. He is afebrile and non-tachycardic. Patient is NAD, non-toxic. Patient is informed of clinical course, understands medical decision making process, and agrees with plan. Opportunity for questions provided and all questions answered. Return precautions given.   Final Clinical Impressions(s) / ED Diagnoses   Final diagnoses:  Otalgia of left ear  Acute allergic rhinitis, unspecified seasonality, unspecified trigger    New Prescriptions Discharge Medication List as of 01/08/2016  2:42 PM      I personally performed the services described in this documentation, which was scribed in my presence. The recorded information has been reviewed and is accurate.        Bethel BornKelly Marie Asher Torpey, PA-C 01/13/16 [phone]    Loren Raceravid Yelverton, MD 01/13/16 [phone]

## 2016-01-08 NOTE — ED Triage Notes (Signed)
PT states L ear pain x 1 month.  He came in today because he's "tired of it".

## 2016-01-08 NOTE — ED Notes (Signed)
Declined W/C at D/C and was escorted to lobby by RN. 

## 2016-05-08 ENCOUNTER — Emergency Department (HOSPITAL_COMMUNITY)
Admission: EM | Admit: 2016-05-08 | Discharge: 2016-05-08 | Payer: Medicaid Other | Attending: Emergency Medicine | Admitting: Emergency Medicine

## 2016-05-08 ENCOUNTER — Encounter (HOSPITAL_COMMUNITY): Payer: Self-pay | Admitting: *Deleted

## 2016-05-08 ENCOUNTER — Emergency Department (HOSPITAL_COMMUNITY): Payer: Medicaid Other

## 2016-05-08 DIAGNOSIS — Z79899 Other long term (current) drug therapy: Secondary | ICD-10-CM | POA: Diagnosis not present

## 2016-05-08 DIAGNOSIS — G8929 Other chronic pain: Secondary | ICD-10-CM | POA: Diagnosis not present

## 2016-05-08 DIAGNOSIS — M25511 Pain in right shoulder: Secondary | ICD-10-CM | POA: Insufficient documentation

## 2016-05-08 DIAGNOSIS — F17210 Nicotine dependence, cigarettes, uncomplicated: Secondary | ICD-10-CM | POA: Insufficient documentation

## 2016-05-08 HISTORY — DX: Recurrent dislocation, right shoulder: M24.411

## 2016-05-08 MED ORDER — METHOCARBAMOL 500 MG PO TABS
500.0000 mg | ORAL_TABLET | Freq: Three times a day (TID) | ORAL | 0 refills | Status: DC
Start: 2016-05-08 — End: 2017-06-02

## 2016-05-08 MED ORDER — DICLOFENAC SODIUM 50 MG PO TBEC: 50.0000 mg | DELAYED_RELEASE_TABLET | Freq: Two times a day (BID) | ORAL | 0 refills | Status: DC

## 2016-05-08 NOTE — Discharge Instructions (Signed)
Patient is medically cleared to go to jail. Begin taking Robaxin and Voltaren for pain and inflammation as directed. Continue range of motion exercises as tolerated. Return to ED for increased pain, additional injury, numbness, weakness, or decreased strength.

## 2016-05-08 NOTE — ED Provider Notes (Signed)
[facility]-EMERGENCY DEPT Provider Note   CSN: [id number] Arrival date & time: 05/08/16  1039  By signing my name below, I, Marnette Burgess Long, attest that this documentation has been prepared under the direction and in the presence of Tyronn Golda, PA-C. Electronically Signed: Marnette Burgess Long, Scribe. 05/08/2016. 11:09 AM.  History   Chief Complaint Chief Complaint  Patient presents with  . Shoulder Injury   The history is provided by the patient and medical records. No language interpreter was used.   HPI Comments:  Christopher Mcmahon is a 36 y.o. male with no pertinent PMHx, who presents to the Emergency Department by way of the Police, complaining of chronic, intermittent, radiating, right sided, 10/10 shoulder pain onset several years ago. He reports today's pain is similar to pain felt in the past. He states this pain originated s/p moving furniture a couple years ago. He followed up with orthopedics at that time but did not follow through with his PT exercises. No new injuries or falls stated. Pt has an associated symptom of right arm pain radiating from his shoulder. He tried Ibuprofen at home with minimal relief of his pain. He additionally notes muscle relaxants he has received in the past minimally help relieve his pain. His pain is intermittent and is exacerbated by exertion, direct palpation, and movement. Pt denies CP, SOB, abdominal pain, nausea, vomiting, numbness, and any other complaints at this time. Pt is a current every day smoker.   Past Medical History:  Diagnosis Date  . Shoulder dislocation, recurrent, right     There are no active problems to display for this patient.  History reviewed. No pertinent surgical history.  Home Medications    Prior to Admission medications   Medication Sig Start Date End Date Taking? Authorizing Provider  cetirizine-pseudoephedrine (ZYRTEC-D) 5-120 MG tablet Take 1 tablet by mouth daily. 01/08/16   Bethel Born, PA-C  diclofenac  (VOLTAREN) 50 MG EC tablet Take 1 tablet (50 mg total) by mouth 2 (two) times daily. 05/08/16   Emmanuela Ghazi, PA-C  ibuprofen (ADVIL,MOTRIN) 800 MG tablet Take 1 tablet (800 mg total) by mouth 3 (three) times daily. 05/09/15   Elson Areas, PA-C  levofloxacin (LEVAQUIN) 750 MG tablet Take 1 tablet (750 mg total) by mouth daily. X 7 days 01/26/15   Charlynne Pander, MD  methocarbamol (ROBAXIN) 500 MG tablet Take 1 tablet (500 mg total) by mouth 3 (three) times daily. 05/08/16   Brinlyn Cena, PA-C    Family History No family history on file.  Social History Social History  Substance Use Topics  . Smoking status: Current Every Day Smoker    Types: Cigarettes, Cigars  . Smokeless tobacco: Never Used  . Alcohol use Yes     Comment: occ     Allergies   Patient has no known allergies.   Review of Systems Review of Systems  Respiratory: Negative for shortness of breath.   Cardiovascular: Negative for chest pain.  Gastrointestinal: Negative for abdominal pain, nausea and vomiting.  Musculoskeletal: Positive for arthralgias and myalgias.  Neurological: Negative for numbness.  All other systems reviewed and are negative.    Physical Exam Updated Vital Signs BP (!) 146/98 (BP Location: Right Arm)   Pulse 65   Temp 98.8 F (37.1 C) (Oral)   Resp 19   Ht  (1.676 m)   Wt 74.8 kg   SpO2 100%   BMI 26.63 kg/m   Physical Exam  Constitutional: He appears well-developed and well-nourished.  No distress.  HENT:  Head: Normocephalic and atraumatic.  Eyes: Conjunctivae and EOM are normal. No scleral icterus.  Neck: Normal range of motion.  Cardiovascular: Normal rate and regular rhythm.   Bilateral Radial pulses 2+  Pulmonary/Chest: Effort normal and breath sounds normal. No respiratory distress.  Abdominal: Soft. Bowel sounds are normal. He exhibits no distension. There is no tenderness.  Musculoskeletal:  Pain with passive abduction of the right shoulder. Good ROM of his  elbow. Normal sensation. No edema or ecchymosis to the area.   Neurological: He is alert.  Skin: No rash noted. He is not diaphoretic.  Psychiatric: He has a normal mood and affect.  Nursing note and vitals reviewed.    ED Treatments / Results  DIAGNOSTIC STUDIES:  Oxygen Saturation is 100% on RA, normal by my interpretation.    COORDINATION OF CARE:  11:07 AM Discussed treatment plan with pt at bedside including XR of right shoulder, muscle relaxant, shoulder immobilizer, and an anti-inflammatory and pt agreed to plan.  Labs (all labs ordered are listed, but only abnormal results are displayed) Labs Reviewed - No data to display  EKG  EKG Interpretation None       Radiology Dg Shoulder Right  Result Date: 05/08/2016 CLINICAL DATA:  Shoulder pain and injury EXAM: RIGHT SHOULDER - 2+ VIEW COMPARISON:  08/29/2014 FINDINGS: There is no evidence of fracture or dislocation. There is no evidence of arthropathy or other focal bone abnormality. Soft tissues are unremarkable. IMPRESSION: Negative. Electronically Signed   By: Marlan Palau M.D.   On: 05/08/2016 11:06    Procedures Procedures (including critical care time)  Medications Ordered in ED Medications - No data to display   Initial Impression / Assessment and Plan / ED Course  I have reviewed the triage vital signs and the nursing notes.  Pertinent labs & imaging results that were available during my care of the patient were reviewed by me and considered in my medical decision making (see chart for details).     Patient's history and symptoms concerning for dislocation vs. Fracture vs. Joint inflammation. Patient X-Ray negative for obvious fracture or dislocation.  Symptoms likely due to joint inflammation, considering he has had this pain for over one year. Pt advised to follow up with orthopedics at earliest convenience. Patient given shoulder immobilizer while in ED, conservative therapy recommended and  discussed,and given anti-inflammatories and muscle relaxer. Patient will be discharged with police & is agreeable with above plan. Deemed medically cleared for jail. Returns precautions discussed. Pt appears safe for discharge.   Final Clinical Impressions(s) / ED Diagnoses   Final diagnoses:  Chronic right shoulder pain    New Prescriptions Discharge Medication List as of 05/08/2016 11:21 AM     I personally performed the services described in this documentation, which was scribed in my presence. The recorded information has been reviewed and is accurate.     Dietrich Pates, PA-C 05/08/16 1149    Laurence Spates, MD 05/09/16 [phone]

## 2016-05-08 NOTE — ED Notes (Signed)
Patient transported to X-ray 

## 2016-05-08 NOTE — ED Triage Notes (Signed)
Pt is being brought in to be medically cleared to go to jail.  Pt was picked up for a parole violation but the jail would not accept him due to right shoulder injury which is chronic.  Pt reports that he dislocated his shoulder a year ago and it has been this way since.

## 2017-04-14 ENCOUNTER — Other Ambulatory Visit: Payer: Self-pay

## 2017-04-14 ENCOUNTER — Emergency Department (HOSPITAL_COMMUNITY): Payer: Medicaid Other

## 2017-04-14 DIAGNOSIS — Z79899 Other long term (current) drug therapy: Secondary | ICD-10-CM | POA: Diagnosis not present

## 2017-04-14 DIAGNOSIS — J069 Acute upper respiratory infection, unspecified: Secondary | ICD-10-CM | POA: Insufficient documentation

## 2017-04-14 DIAGNOSIS — Z791 Long term (current) use of non-steroidal anti-inflammatories (NSAID): Secondary | ICD-10-CM | POA: Insufficient documentation

## 2017-04-14 DIAGNOSIS — F17210 Nicotine dependence, cigarettes, uncomplicated: Secondary | ICD-10-CM | POA: Diagnosis not present

## 2017-04-14 DIAGNOSIS — R05 Cough: Secondary | ICD-10-CM | POA: Diagnosis present

## 2017-04-14 LAB — CBC
HEMATOCRIT: 41.7 % (ref 39.0–52.0)
Hemoglobin: 13.4 g/dL (ref 13.0–17.0)
MCH: 29.4 pg (ref 26.0–34.0)
MCHC: 32.1 g/dL (ref 30.0–36.0)
MCV: 91.4 fL (ref 78.0–100.0)
PLATELETS: 254 10*3/uL (ref 150–400)
RBC: 4.56 MIL/uL (ref 4.22–5.81)
RDW: 14.2 % (ref 11.5–15.5)
WBC: 13.7 10*3/uL — ABNORMAL HIGH (ref 4.0–10.5)

## 2017-04-14 LAB — BASIC METABOLIC PANEL
Anion gap: 9 (ref 5–15)
BUN: 18 mg/dL (ref 6–20)
CHLORIDE: 107 mmol/L (ref 101–111)
CO2: 22 mmol/L (ref 22–32)
CREATININE: 0.77 mg/dL (ref 0.61–1.24)
Calcium: 9.1 mg/dL (ref 8.9–10.3)
GFR calc Af Amer: >60 mL/min (ref >60)
GFR calc non Af Amer: >60 mL/min (ref >60)
GLUCOSE: 104 mg/dL — AB (ref 65–99)
POTASSIUM: 4.1 mmol/L (ref 3.5–5.1)
Sodium: 138 mmol/L (ref 135–145)

## 2017-04-14 LAB — I-STAT TROPONIN, ED: Troponin i, poc: 0 ng/mL (ref 0.00–0.08)

## 2017-04-14 NOTE — ED Triage Notes (Signed)
Pt reports congestion, coughing x 3-4 days, states tried dayquil and nyquil at home w/ no relief. Denies fevers. Pt reports no known sick contacts. Endorses chest pain w/ coughing and sneezing

## 2017-04-15 ENCOUNTER — Emergency Department (HOSPITAL_COMMUNITY)
Admission: EM | Admit: 2017-04-15 | Discharge: 2017-04-15 | Disposition: A | Discharge status: Home / Self Care | Payer: Medicaid Other | Attending: Emergency Medicine | Admitting: Emergency Medicine

## 2017-04-15 DIAGNOSIS — B9789 Other viral agents as the cause of diseases classified elsewhere: Secondary | ICD-10-CM

## 2017-04-15 DIAGNOSIS — J069 Acute upper respiratory infection, unspecified: Secondary | ICD-10-CM

## 2017-04-15 MED ORDER — NAPROXEN 250 MG PO TABS
500.0000 mg | ORAL_TABLET | Freq: Once | ORAL | Status: AC
  Administered 2017-04-15: 500 mg via ORAL
  Filled 2017-04-15: qty 2

## 2017-04-15 MED ORDER — ALBUTEROL SULFATE HFA 108 (90 BASE) MCG/ACT IN AERS: 2.0000 | INHALATION_SPRAY | RESPIRATORY_TRACT | 0 refills | Status: AC | PRN

## 2017-04-15 MED ORDER — NAPROXEN 500 MG PO TABS: 500.0000 mg | ORAL_TABLET | Freq: Two times a day (BID) | ORAL | 0 refills | Status: DC

## 2017-04-15 MED ORDER — ALBUTEROL SULFATE HFA 108 (90 BASE) MCG/ACT IN AERS
2.0000 | INHALATION_SPRAY | Freq: Once | RESPIRATORY_TRACT | Status: AC
  Administered 2017-04-15: 2 via RESPIRATORY_TRACT
  Filled 2017-04-15: qty 6.7

## 2017-04-15 NOTE — ED Provider Notes (Signed)
[facility] Provider Note   CSN: [id number] Arrival date & time: 04/14/17  2036     History   Chief Complaint Chief Complaint  Patient presents with  . Chest Pain  . Cough    HPI Christopher Mcmahon is a 37 y.o. male.  HPI  This is a 37 year old male who presents with 3-4-day history of cough.  Patient reports cough that is occasionally productive of brown sputum.  Denies any fevers or chills.  Does report some rhinorrhea.  He has taken DayQuil and NyQuil with no relief.  He occasionally has some chest discomfort with coughing.  Mother had the flu 3 weeks ago.  He denies any abdominal pain, vomiting, diarrhea.  He is a smoker.  Past Medical History:  Diagnosis Date  . Shoulder dislocation, recurrent, right     There are no active problems to display for this patient.   No past surgical history on file.     Home Medications    Prior to Admission medications   Medication Sig Start Date End Date Taking? Authorizing Provider  albuterol (PROVENTIL HFA;VENTOLIN HFA) 108 (90 Base) MCG/ACT inhaler Inhale 2 puffs into the lungs every 4 (four) hours as needed for wheezing or shortness of breath. 04/15/17   Mahogony Gilchrest, Mayer Maskerourtney F, MD  cetirizine-pseudoephedrine (ZYRTEC-D) 5-120 MG tablet Take 1 tablet by mouth daily. 01/08/16   Bethel BornGekas, Kelly Marie, PA-C  diclofenac (VOLTAREN) 50 MG EC tablet Take 1 tablet (50 mg total) by mouth 2 (two) times daily. 05/08/16   Khatri, Hina, PA-C  ibuprofen (ADVIL,MOTRIN) 800 MG tablet Take 1 tablet (800 mg total) by mouth 3 (three) times daily. 05/09/15   Elson AreasSofia, Leslie K, PA-C  levofloxacin (LEVAQUIN) 750 MG tablet Take 1 tablet (750 mg total) by mouth daily. X 7 days 01/26/15   Charlynne PanderYao, David Hsienta, MD  methocarbamol (ROBAXIN) 500 MG tablet Take 1 tablet (500 mg total) by mouth 3 (three) times daily. 05/08/16   Khatri, Hina, PA-C  naproxen (NAPROSYN) 500 MG tablet Take 1 tablet (500 mg total) by mouth 2 (two) times daily.  04/15/17   Pravin Perezperez, Mayer Maskerourtney F, MD    Family History No family history on file.  Social History Social History   Tobacco Use  . Smoking status: Current Every Day Smoker    Types: Cigarettes, Cigars  . Smokeless tobacco: Never Used  Substance Use Topics  . Alcohol use: Yes    Comment: occ  . Drug use: No     Allergies   Patient has no known allergies.   Review of Systems Review of Systems  Constitutional: Negative for chills and fever.  HENT: Positive for congestion and rhinorrhea.   Respiratory: Positive for cough. Negative for shortness of breath.   Cardiovascular: Positive for chest pain.  Gastrointestinal: Negative for abdominal pain, nausea and vomiting.  Genitourinary: Negative for dysuria.  Neurological: Negative for headaches.  All other systems reviewed and are negative.    Physical Exam Updated Vital Signs BP (!) 136/93   Pulse 68   Temp 97.9 F (36.6 C)   Resp 17   Ht 5\' 10"  (1.778 m)   Wt 77.1 kg (170 lb)   SpO2 100%   BMI 24.39 kg/m   Physical Exam  Constitutional: He is oriented to person, place, and time. He appears well-developed and well-nourished.  HENT:  Head: Normocephalic and atraumatic.  Cardiovascular: Normal rate, regular rhythm and normal heart sounds.  No murmur heard. Chest wall tenderness to palpation without crepitus  Pulmonary/Chest: Effort normal and breath sounds normal. No respiratory distress. He has no wheezes.  Abdominal: Soft. Bowel sounds are normal. There is no tenderness. There is no rebound.  Musculoskeletal: He exhibits no edema.  Neurological: He is alert and oriented to person, place, and time.  Skin: Skin is warm and dry.  Psychiatric: He has a normal mood and affect.  Nursing note and vitals reviewed.    ED Treatments / Results  Labs (all labs ordered are listed, but only abnormal results are displayed) Labs Reviewed  BASIC METABOLIC PANEL - Abnormal; Notable for the following components:      Result  Value   Glucose, Bld 104 (*)    All other components within normal limits  CBC - Abnormal; Notable for the following components:   WBC 13.7 (*)    All other components within normal limits  I-STAT TROPONIN, ED    EKG  EKG Interpretation  Date/Time:  Monday April 14 2017 21:26:44 EDT Ventricular Rate:  76 PR Interval:  128 QRS Duration: 86 QT Interval:  356 QTC Calculation: 400 R Axis:   83 Text Interpretation:  Normal sinus rhythm Biatrial enlargement Left ventricular hypertrophy Abnormal ECG No prior for comparison Confirmed by Ross Marcus ([id number]) on 04/15/2017 3:16:42 AM       Radiology Dg Chest 2 View  Result Date: 04/14/2017 CLINICAL DATA:  Cough and congestion.  Chest pain with cough. EXAM: CHEST - 2 VIEW COMPARISON:  Radiographs 01/26/2015 FINDINGS: Previous retrocardiac opacity has resolved.The cardiomediastinal contours are normal. The lungs are clear. Pulmonary vasculature is normal. No consolidation, pleural effusion, or pneumothorax. No acute osseous abnormalities are seen. IMPRESSION: Unremarkable radiographs of the chest. Electronically Signed   By: Rubye Oaks M.D.   On: 04/14/2017 21:48    Procedures Procedures (including critical care time)  Medications Ordered in ED Medications  naproxen (NAPROSYN) tablet 500 mg (not administered)  albuterol (PROVENTIL HFA;VENTOLIN HFA) 108 (90 Base) MCG/ACT inhaler 2 puff (not administered)     Initial Impression / Assessment and Plan / ED Course  I have reviewed the triage vital signs and the nursing notes.  Pertinent labs & imaging results that were available during my care of the patient were reviewed by me and considered in my medical decision making (see chart for details).     Presents with cough and occasional chest pain.  Nontoxic on exam.  Afebrile.  Vital signs reassuring.  Exam is benign including pulmonary exam.  Chest x-ray shows no evidence of pneumonia.  Lab workup shows a leukocytosis.  This is  nonspecific.  Clinically I have low suspicion for pneumonia.  Suspect viral etiology.  Given smoking, will provide an inhaler.  Patient was given naproxen for pain.  After history, exam, and medical workup I feel the patient has been appropriately medically screened and is safe for discharge home. Pertinent diagnoses were discussed with the patient. Patient was given return precautions.   Final Clinical Impressions(s) / ED Diagnoses   Final diagnoses:  Viral URI with cough    ED Discharge Orders        Ordered    albuterol (PROVENTIL HFA;VENTOLIN HFA) 108 (90 Base) MCG/ACT inhaler  Every 4 hours PRN     04/15/17 0338    naproxen (NAPROSYN) 500 MG tablet  2 times daily     04/15/17 0338       Sahej Hauswirth, Mayer Masker, MD 04/15/17 [id number]

## 2017-06-02 ENCOUNTER — Encounter (HOSPITAL_COMMUNITY): Payer: Self-pay | Admitting: Family Medicine

## 2017-06-02 ENCOUNTER — Ambulatory Visit (INDEPENDENT_AMBULATORY_CARE_PROVIDER_SITE_OTHER): Payer: Medicaid Other

## 2017-06-02 ENCOUNTER — Ambulatory Visit (HOSPITAL_COMMUNITY)
Admission: EM | Admit: 2017-06-02 | Discharge: 2017-06-02 | Disposition: A | Discharge status: Home / Self Care | Payer: Medicaid Other | Attending: Family Medicine | Admitting: Family Medicine

## 2017-06-02 DIAGNOSIS — M25571 Pain in right ankle and joints of right foot: Secondary | ICD-10-CM

## 2017-06-02 DIAGNOSIS — S99919A Unspecified injury of unspecified ankle, initial encounter: Secondary | ICD-10-CM

## 2017-06-02 DIAGNOSIS — S93401A Sprain of unspecified ligament of right ankle, initial encounter: Secondary | ICD-10-CM

## 2017-06-02 MED ORDER — DICLOFENAC SODIUM 75 MG PO TBEC: 75.0000 mg | DELAYED_RELEASE_TABLET | Freq: Two times a day (BID) | ORAL | 0 refills | Status: DC

## 2017-06-02 NOTE — ED Provider Notes (Signed)
[facility]   [id number] 06/02/17 Arrival Time: 1020  ASSESSMENT & PLAN:  1. Ankle injury, initial encounter   2. Sprain of right ankle, unspecified ligament, initial encounter     Imaging: Dg Ankle Complete Right  Result Date: 06/02/2017 CLINICAL DATA:  Christopher Mcmahon last night injuring the right ankle and now has pain with weight-bearing. EXAM: RIGHT ANKLE - COMPLETE 3+ VIEW COMPARISON:  None in PACs FINDINGS: The bones are subjectively adequately mineralized. The joint mortise is preserved. The talar dome is intact. There is no acute malleolar fracture. There is an accessory ossicle versus old avulsion fracture fragment just inferior to the tip of the lateral malleolus. There is mild soft tissue swelling anteriorly and medially. The observed metatarsal bases are intact. IMPRESSION: Mild soft tissue swelling medially without evidence of an acute fracture nor dislocation. Electronically Signed   By: David  [place] M.D.   On: 06/02/2017 12:24   Meds ordered this encounter  Medications  . diclofenac (VOLTAREN) 75 MG EC tablet    Sig: Take 1 tablet (75 mg total) by mouth 2 (two) times daily.    Dispense:  14 tablet    Refill:  0    Natural history and expected course discussed. Questions answered. Rest, ice, compression, elevation (RICE) therapy. Crutches and instructions provided. Transport planner distributed. Fit with ankle brace for use over next 1 week. NSAIDs per medication orders. Follow-up in 1 week. Work note given.  Reviewed expectations re: course of current medical issues. Questions answered. Outlined signs and symptoms indicating need for more acute intervention. Patient verbalized understanding. After Visit Summary given.  SUBJECTIVE: History from: patient. Christopher Mcmahon is a 37 y.o. male who reports persistent moderate pain of his right medial ankle that is stable; described as aching without radiation. Onset: abrupt, last evening. Injury/trama: yes, reports  slipping on water and twisting ankle along with hitting ankle on curb. Relieved by: not moving. Worsened by: certain movements. Associated symptoms: none reported. Extremity sensation changes or weakness: none. Self treatment: has not tried OTCs for relief of pain. History of similar: no  ROS: As per HPI.   OBJECTIVE:  Vitals:   06/02/17 1129  BP: 125/86  Pulse: 76  Resp: 18  Temp: 98.2 F (36.8 C)  SpO2: 97%    General appearance: alert; no distress Extremities: warm and well perfused; symmetrical with no gross deformities; poorly localized tenderness over his right medial ankle with mild swelling and no bruising; ROM: normal but with reported discomfort CV: normal extremity capillary refill Skin: warm and dry Neurologic: normal gait; normal symmetric reflexes in all extremities; normal sensation in all extremities Psychological: alert and cooperative; normal mood and affect  No Known Allergies  Past Medical History:  Diagnosis Date  . Shoulder dislocation, recurrent, right    Social History   Socioeconomic History  . Marital status: Single    Spouse name: Not on file  . Number of children: Not on file  . Years of education: Not on file  . Highest education level: Not on file  Occupational History  . Not on file  Social Needs  . Financial resource strain: Not on file  . Food insecurity:    Worry: Not on file    Inability: Not on file  . Transportation needs:    Medical: Not on file    Non-medical: Not on file  Tobacco Use  . Smoking status: Current Every Day Smoker    Types: Cigarettes, Cigars  . Smokeless tobacco: Never Used  Substance and Sexual Activity  . Alcohol use: Yes    Comment: occ  . Drug use: No  . Sexual activity: Not on file  Lifestyle  . Physical activity:    Days per week: Not on file    Minutes per session: Not on file  . Stress: Not on file  Relationships  . Social connections:    Talks on phone: Not on file    Gets together:  Not on file    Attends religious service: Not on file    Active member of club or organization: Not on file    Attends meetings of clubs or organizations: Not on file    Relationship status: Not on file  . Intimate partner violence:    Fear of current or ex partner: Not on file    Emotionally abused: Not on file    Physically abused: Not on file    Forced sexual activity: Not on file  Other Topics Concern  . Not on file  Social History Narrative  . Not on file   History reviewed. No pertinent surgical history.    Christopher Layman, MD 06/02/17 1301

## 2017-06-02 NOTE — ED Triage Notes (Signed)
Pt here for right ankle pain after slipping in the rain yesterday. Pain with ambulation and swelling.

## 2017-07-30 ENCOUNTER — Other Ambulatory Visit: Payer: Self-pay

## 2017-07-30 DIAGNOSIS — F17210 Nicotine dependence, cigarettes, uncomplicated: Secondary | ICD-10-CM | POA: Insufficient documentation

## 2017-07-30 DIAGNOSIS — M5442 Lumbago with sciatica, left side: Secondary | ICD-10-CM | POA: Diagnosis not present

## 2017-07-30 DIAGNOSIS — M5441 Lumbago with sciatica, right side: Secondary | ICD-10-CM | POA: Insufficient documentation

## 2017-07-30 DIAGNOSIS — F17290 Nicotine dependence, other tobacco product, uncomplicated: Secondary | ICD-10-CM | POA: Diagnosis not present

## 2017-07-30 DIAGNOSIS — M545 Low back pain: Secondary | ICD-10-CM | POA: Diagnosis present

## 2017-07-31 ENCOUNTER — Emergency Department (HOSPITAL_COMMUNITY): Payer: Medicaid Other

## 2017-07-31 ENCOUNTER — Emergency Department (HOSPITAL_COMMUNITY)
Admission: EM | Admit: 2017-07-31 | Discharge: 2017-07-31 | Disposition: A | Discharge status: Home / Self Care | Payer: Medicaid Other | Attending: Emergency Medicine | Admitting: Emergency Medicine

## 2017-07-31 ENCOUNTER — Encounter (HOSPITAL_COMMUNITY): Payer: Self-pay | Admitting: Emergency Medicine

## 2017-07-31 DIAGNOSIS — M5442 Lumbago with sciatica, left side: Secondary | ICD-10-CM

## 2017-07-31 DIAGNOSIS — M5441 Lumbago with sciatica, right side: Secondary | ICD-10-CM

## 2017-07-31 LAB — URINALYSIS, ROUTINE W REFLEX MICROSCOPIC
BILIRUBIN URINE: NEGATIVE
Glucose, UA: 50 mg/dL — AB
Ketones, ur: NEGATIVE mg/dL
LEUKOCYTES UA: NEGATIVE
Nitrite: NEGATIVE
PROTEIN: NEGATIVE mg/dL
Specific Gravity, Urine: 1.017 (ref 1.005–1.030)
pH: 5 (ref 5.0–8.0)

## 2017-07-31 MED ORDER — PREDNISONE 50 MG PO TABS: ORAL_TABLET | ORAL | 0 refills | Status: DC

## 2017-07-31 MED ORDER — HYDROCODONE-ACETAMINOPHEN 5-325 MG PO TABS
2.0000 | ORAL_TABLET | Freq: Once | ORAL | Status: AC
  Administered 2017-07-31: 2 via ORAL
  Filled 2017-07-31: qty 2

## 2017-07-31 MED ORDER — IBUPROFEN 400 MG PO TABS
400.0000 mg | ORAL_TABLET | Freq: Once | ORAL | Status: AC
  Administered 2017-07-31: 400 mg via ORAL
  Filled 2017-07-31: qty 1

## 2017-07-31 MED ORDER — HYDROCODONE-ACETAMINOPHEN 5-325 MG PO TABS: 1.0000 | ORAL_TABLET | Freq: Four times a day (QID) | ORAL | 0 refills | Status: AC | PRN

## 2017-07-31 NOTE — ED Triage Notes (Signed)
Pt reports back pain for three weeks, states he woke up with it. Unsure of heavy lifting injury. No tingling or numbness, denies loss of control of bowel and bladder

## 2017-07-31 NOTE — ED Provider Notes (Signed)
[facility] EMERGENCY DEPARTMENT Provider Note   CSN: [id number] Arrival date & time: 07/30/17  2358     History   Chief Complaint Chief Complaint  Patient presents with  . Back Pain    HPI Christopher Mcmahon is a 37 y.o. male.  The history is provided by the patient and the spouse.  Back Pain   This is a new problem. The current episode started more than 1 week ago. The problem occurs daily. The problem has been gradually worsening. The pain is associated with no known injury. The pain is present in the lumbar spine. The pain radiates to the right thigh and left thigh. The pain is severe. The symptoms are aggravated by certain positions. The pain is the same all the time. Pertinent negatives include no fever, no abdominal pain, no bowel incontinence, no bladder incontinence and no weakness. He has tried NSAIDs for the symptoms. The treatment provided no relief.   Patient reports onset of back pain over 3 weeks ago.  Denies any trauma.  No recent heavy lifting.  Denies any bowel or bladder incontinence.  It hurts to ambulate.  No abdominal pain. Past Medical History:  Diagnosis Date  . Shoulder dislocation, recurrent, right     There are no active problems to display for this patient.   History reviewed. No pertinent surgical history.      Home Medications    Prior to Admission medications   Medication Sig Start Date End Date Taking? Authorizing Provider  albuterol (PROVENTIL HFA;VENTOLIN HFA) 108 (90 Base) MCG/ACT inhaler Inhale 2 puffs into the lungs every 4 (four) hours as needed for wheezing or shortness of breath. 04/15/17   Horton, Mayer Masker, MD  diclofenac (VOLTAREN) 75 MG EC tablet Take 1 tablet (75 mg total) by mouth 2 (two) times daily. 06/02/17   Mardella Layman, MD    Family History History reviewed. No pertinent family history.  Social History Social History   Tobacco Use  . Smoking status: Current Every Day Smoker    Packs/day: 0.00   Types: Cigarettes, Cigars  . Smokeless tobacco: Never Used  Substance Use Topics  . Alcohol use: Yes    Comment: occ  . Drug use: No     Allergies   Patient has no known allergies.   Review of Systems Review of Systems  Constitutional: Negative for fever.  Gastrointestinal: Negative for abdominal pain and bowel incontinence.  Genitourinary: Negative for bladder incontinence and difficulty urinating.  Musculoskeletal: Positive for back pain.  Neurological: Negative for weakness.  All other systems reviewed and are negative.    Physical Exam Updated Vital Signs BP 118/65 (BP Location: Right Arm)   Pulse 87   Temp 98 F (36.7 C)   Resp 18   SpO2 100%   Physical Exam  CONSTITUTIONAL: Well developed/well nourished HEAD: Normocephalic/atraumatic EYES: EOMI/PERRL ENMT: Mucous membranes moist NECK: supple no meningeal signs SPINE/BACK: Lumbar spinal and paraspinal tenderness, no bruising/crepitance/stepoffs noted to spine CV: S1/S2 noted, no murmurs/rubs/gallops noted LUNGS: Lungs are clear to auscultation bilaterally, no apparent distress ABDOMEN: soft, nontender, no rebound or guarding GU:no cva tenderness NEURO: Awake/alert, equal motor 5/5 strength noted with the following: hip flexion/knee flexion/extension, foot dorsi/plantar flexion, great toe extension intact bilaterally, no clonus bilaterally, plantar reflex appropriate (toes downgoing), no sensory deficit in any dermatome.  Equal patellar/achilles reflex noted (2+) in bilateral lower extremities.  Pt is able to ambulate unassisted. EXTREMITIES: pulses normal, full ROM SKIN: warm, color normal PSYCH: no abnormalities  of mood noted, alert and oriented to situation   ED Treatments / Results  Labs (all labs ordered are listed, but only abnormal results are displayed) Labs Reviewed  URINALYSIS, ROUTINE W REFLEX MICROSCOPIC - Abnormal; Notable for the following components:      Result Value   Glucose, UA 50 (*)      Hgb urine dipstick SMALL (*)    Bacteria, UA RARE (*)    All other components within normal limits    EKG None  Radiology Dg Thoracic Spine 2 View  Result Date: 07/31/2017 CLINICAL DATA:  Back pain EXAM: THORACIC SPINE 2 VIEWS COMPARISON:  Chest x-ray 04/14/2017 FINDINGS: There is no evidence of thoracic spine fracture. Alignment is normal. No other significant bone abnormalities are identified. IMPRESSION: Negative. Electronically Signed   By: Jasmine PangKim  Fujinaga M.D.   On: 07/31/2017 00:50   Dg Lumbar Spine 2-3 Views  Result Date: 07/31/2017 CLINICAL DATA:  Back pain EXAM: LUMBAR SPINE - 2-3 VIEW COMPARISON:  None. FINDINGS: There is no evidence of lumbar spine fracture. Alignment is normal. Intervertebral disc spaces are maintained. IMPRESSION: Negative. Electronically Signed   By: Jasmine PangKim  Fujinaga M.D.   On: 07/31/2017 00:50    Procedures Procedures   Medications Ordered in ED Medications  ibuprofen (ADVIL,MOTRIN) tablet 400 mg (400 mg Oral Given 07/31/17 0313)  HYDROcodone-acetaminophen (NORCO/VICODIN) 5-325 MG per tablet 2 tablet (2 tablets Oral Given 07/31/17 0313)  Narcotic database reviewed and considered in decision making   Initial Impression / Assessment and Plan / ED Course  I have reviewed the triage vital signs and the nursing notes.  Pertinent labs & imaging results that were available during my care of the patient were reviewed by me and considered in my medical decision making (see chart for details).     No focal neuro deficits, patient can ambulate. Short course of pain medicines, will give a burst of steroids.  Referred to a PCP as well as neurosurgery.  We discussed strict ER return precautions.  Final Clinical Impressions(s) / ED Diagnoses   Final diagnoses:  Acute midline low back pain with bilateral sciatica    ED Discharge Orders        Ordered    HYDROcodone-acetaminophen (NORCO/VICODIN) 5-325 MG tablet  Every 6 hours PRN     07/31/17 0407    predniSONE  (DELTASONE) 50 MG tablet     07/31/17 0407       Zadie RhineWickline, Micheale Schlack, MD 07/31/17 [id number]

## 2017-07-31 NOTE — Discharge Instructions (Addendum)

## 2017-07-31 NOTE — ED Notes (Signed)
Signature pad not available. Discharge instructions reviewed. Pt verbalized understanding. All questions and concerns addressed. Pt ready for discharge.

## 2018-01-28 ENCOUNTER — Encounter (HOSPITAL_COMMUNITY): Payer: Self-pay

## 2018-01-28 ENCOUNTER — Emergency Department (HOSPITAL_COMMUNITY): Payer: Medicaid Other

## 2018-01-28 ENCOUNTER — Other Ambulatory Visit: Payer: Self-pay

## 2018-01-28 ENCOUNTER — Emergency Department (HOSPITAL_COMMUNITY)
Admission: EM | Admit: 2018-01-28 | Discharge: 2018-01-28 | Disposition: A | Discharge status: Home / Self Care | Payer: Medicaid Other | Attending: Emergency Medicine | Admitting: Emergency Medicine

## 2018-01-28 DIAGNOSIS — J029 Acute pharyngitis, unspecified: Secondary | ICD-10-CM | POA: Diagnosis present

## 2018-01-28 DIAGNOSIS — F17210 Nicotine dependence, cigarettes, uncomplicated: Secondary | ICD-10-CM | POA: Diagnosis not present

## 2018-01-28 DIAGNOSIS — H9203 Otalgia, bilateral: Secondary | ICD-10-CM | POA: Diagnosis not present

## 2018-01-28 DIAGNOSIS — J111 Influenza due to unidentified influenza virus with other respiratory manifestations: Secondary | ICD-10-CM | POA: Insufficient documentation

## 2018-01-28 DIAGNOSIS — R69 Illness, unspecified: Secondary | ICD-10-CM

## 2018-01-28 LAB — INFLUENZA PANEL BY PCR (TYPE A & B)
Influenza A By PCR: NEGATIVE
Influenza B By PCR: NEGATIVE

## 2018-01-28 LAB — GROUP A STREP BY PCR: Group A Strep by PCR: NOT DETECTED

## 2018-01-28 MED ORDER — OSELTAMIVIR PHOSPHATE 75 MG PO CAPS: 75.0000 mg | ORAL_CAPSULE | Freq: Two times a day (BID) | ORAL | 0 refills | Status: AC

## 2018-01-28 MED ORDER — ONDANSETRON HCL 4 MG PO TABS: 4.0000 mg | ORAL_TABLET | Freq: Four times a day (QID) | ORAL | 0 refills | Status: AC

## 2018-01-28 MED ORDER — ACETAMINOPHEN 500 MG PO TABS
1000.0000 mg | ORAL_TABLET | Freq: Once | ORAL | Status: AC
  Administered 2018-01-28: 1000 mg via ORAL
  Filled 2018-01-28: qty 2

## 2018-01-28 NOTE — ED Provider Notes (Signed)
[facility] EMERGENCY DEPARTMENT Provider Note   CSN: [id number] Arrival date & time: 01/28/18  1159     History   Chief Complaint Chief Complaint  Patient presents with  . Influenza    HPI Christopher Mcmahon is a 38 y.o. male who presents for evaluation of sore throat, rhinorrhea, bilateral ear pain, cough, headache that began yesterday.  Patient reports cough is productive of green and yellow phlegm.  Patient reports that sore throat has worsened since onset of symptoms.  He has been able to tolerate his secretions and p.o. but does report worsening pain with swallowing.  He has not taken any medications for the pain.  He reports that he did have a fever measured at home.  He states that he has been taking ibuprofen for fever.  Patient states that he has not had much of an appetite since onset of symptoms.  Patient states that he does not have a history of asthma but does report history of smoking.  No vaping.  Patient reports he did not get a flu shot this year.  Patient states that his kids had been sick with upper respiratory symptoms for the last several weeks.  Patient denies any chest pain, difficulty breathing, abdominal pain, vomiting.  The history is provided by the patient.    Past Medical History:  Diagnosis Date  . Shoulder dislocation, recurrent, right     There are no active problems to display for this patient.   History reviewed. No pertinent surgical history.      Home Medications    Prior to Admission medications   Medication Sig Start Date End Date Taking? Authorizing Provider  albuterol (PROVENTIL HFA;VENTOLIN HFA) 108 (90 Base) MCG/ACT inhaler Inhale 2 puffs into the lungs every 4 (four) hours as needed for wheezing or shortness of breath. 04/15/17   Horton, Mayer Masker, MD  HYDROcodone-acetaminophen (NORCO/VICODIN) 5-325 MG tablet Take 1 tablet by mouth every 6 (six) hours as needed for severe pain. 07/31/17   Zadie Rhine, MD  ondansetron  (ZOFRAN) 4 MG tablet Take 1 tablet (4 mg total) by mouth every 6 (six) hours. 01/28/18   Maxwell Caul, PA-C  oseltamivir (TAMIFLU) 75 MG capsule Take 1 capsule (75 mg total) by mouth every 12 (twelve) hours. 01/28/18   Maxwell Caul, PA-C  predniSONE (DELTASONE) 50 MG tablet One tablet PO daily for 7 days 07/31/17   Zadie Rhine, MD    Family History History reviewed. No pertinent family history.  Social History Social History   Tobacco Use  . Smoking status: Current Every Day Smoker    Packs/day: 0.00    Types: Cigarettes, Cigars  . Smokeless tobacco: Never Used  Substance Use Topics  . Alcohol use: Yes    Comment: occ  . Drug use: No     Allergies   Patient has no known allergies.   Review of Systems Review of Systems  Constitutional: Positive for activity change, appetite change, chills and fever.  HENT: Positive for ear pain, rhinorrhea and sore throat. Negative for drooling.   Respiratory: Positive for cough. Negative for shortness of breath.   Cardiovascular: Negative for chest pain.  Gastrointestinal: Negative for abdominal pain, nausea and vomiting.  Genitourinary: Negative for dysuria and hematuria.  Neurological: Positive for headaches.  All other systems reviewed and are negative.    Physical Exam Updated Vital Signs BP 121/85   Pulse (!) 105   Temp 99.8 F (37.7 C) (Oral)   Resp 18  Ht 5\' 10"  (1.778 m)   Wt 77.1 kg   SpO2 96%   BMI 24.39 kg/m   Physical Exam Vitals signs and nursing note reviewed.  Constitutional:      Appearance: He is well-developed.  HENT:     Head: Normocephalic and atraumatic.     Right Ear: Tympanic membrane normal.     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  No surrounding erythema, bulging.  Right TM without any abnormalities.    Nose: Congestion present.     Comments: Edematous and erythematous nasal turbinates bilaterally.    Mouth/Throat:     Mouth: Mucous membranes are  moist.     Pharynx: Posterior oropharyngeal erythema present.     Comments: Posterior oropharynx is erythematous and edematous.  No evidence of tonsillar exudates.  Uvula is midline.  No trismus.  Airways patent, phonation is intact. Eyes:     General: No scleral icterus.       Right eye: No discharge.        Left eye: No discharge.     Conjunctiva/sclera: Conjunctivae normal.  Neck:     Musculoskeletal: Full passive range of motion without pain and neck supple. No neck rigidity.     Comments: Neck is supple without any signs of rigidity. Pulmonary:     Effort: Pulmonary effort is normal.     Breath sounds: Normal breath sounds.     Comments: Lungs clear to auscultation bilaterally.  Symmetric chest rise.  No wheezing, rales, rhonchi.  Able to speak in full sentences without any difficulty. Abdominal:     Palpations: Abdomen is soft.     Tenderness: There is generalized abdominal tenderness. There is no right CVA tenderness or left CVA tenderness.     Comments: Abdomen is soft, non-distended, non-tender. No rigidity, No guarding. No peritoneal signs.  Skin:    General: Skin is warm and dry.  Neurological:     Mental Status: He is alert.  Psychiatric:        Speech: Speech normal.        Behavior: Behavior normal.      ED Treatments / Results  Labs (all labs ordered are listed, but only abnormal results are displayed) Labs Reviewed  GROUP A STREP BY PCR  INFLUENZA PANEL BY PCR (TYPE A & B)    EKG None  Radiology Dg Chest 2 View  Result Date: 01/28/2018 CLINICAL DATA:  Cough EXAM: CHEST - 2 VIEW COMPARISON:  04/14/2017 FINDINGS: The heart size and mediastinal contours are within normal limits. Both lungs are clear. The visualized skeletal structures are unremarkable. IMPRESSION: No active cardiopulmonary disease. Electronically Signed   By: Jasmine PangKim  Fujinaga M.D.   On: 01/28/2018 14:06    Procedures Procedures (including critical care time)  Medications Ordered in  ED Medications  acetaminophen (TYLENOL) tablet 1,000 mg (1,000 mg Oral Given 01/28/18 1333)     Initial Impression / Assessment and Plan / ED Course  I have reviewed the triage vital signs and the nursing notes.  Pertinent labs & imaging results that were available during my care of the patient were reviewed by me and considered in my medical decision making (see chart for details).     38 year old male who presents for evaluation of headache, sore throat, area, fever, chills that began yesterday.  Reports sick contacts at home but states that prior to onset of symptoms, he was feeling fine.  Distally to arrival, patient is  febrile.  Vital signs otherwise stable.  Patient with posterior oropharynx erythema and nasal congestion.  Suspect flu given consolation of symptoms.  Also consider URI.  Low suspicion for pneumonia but also consideration given cough and fever.  Also consider pharyngitis.  History/physical exam not concerning for Ludwig angina, peritonsillar abscess.  History/physical exam is not concerning for meningitis.  Will plan for flu, strep, chest x-ray.  Chest x-ray negative for any acute abnormality.  No infectious infiltrate seen.  Strep negative.  Flu negative.  Repeat vitals show improved fever.  Patient is able to tolerate p.o. without any difficulty.  I suspect that his symptoms are likely related to flu even with negative flu test.  We will plan to treat as influenza.  Discussed results with patient.  Patient is agreeable to plan.  Discussed with him treatment options. He does wish to try tamiflu. Given <48 hours since onset of symptoms, I feel this is reasonable.  Encouraged at home supportive care measures. Patient had ample opportunity for questions and discussion. All patient's questions were answered with full understanding. Strict return precautions discussed. Patient expresses understanding and agreement to plan.   Final Clinical Impressions(s) / ED Diagnoses   Final  diagnoses:  Influenza-like illness    ED Discharge Orders         Ordered    oseltamivir (TAMIFLU) 75 MG capsule  Every 12 hours     01/28/18 1504    ondansetron (ZOFRAN) 4 MG tablet  Every 6 hours     01/28/18 1504           Maxwell CaulLayden, Lindsey A, PA-C 01/28/18 1514    Rolan BuccoBelfi, Melanie, MD 01/29/18 2326

## 2018-01-28 NOTE — ED Notes (Signed)
Patient verbalizes understanding of discharge instructions. Opportunity for questioning and answers were provided. Armband removed by staff, pt discharged from ED ambulatory.   

## 2018-01-28 NOTE — Discharge Instructions (Signed)
As we discussed, your symptoms are suspicious for flu.  We will plan to treat accordingly.  You can take tamiflu if you desire.  As discussed, this is not a cure but could reduce symptoms by day.  Additionally, this may give you some more nausea.  Please take Zofran as directed.  You can take Tylenol or Ibuprofen as directed for pain. You can alternate Tylenol and Ibuprofen every 4 hours. If you take Tylenol at 1pm, then you can take Ibuprofen at 5pm. Then you can take Tylenol again at 9pm.   Monitor your symptoms closely.  Make sure you drink plenty of fluids and stay hydrated.  Return emergency department immediately if you start having persistent fever despite Tylenol or ibuprofen, persistent vomiting, difficulty breathing, abdominal pain or any other worsening or concerning symptoms.

## 2018-01-28 NOTE — ED Triage Notes (Signed)
Pt here with c/o flu like symptoms times a few days , pt has had cough and low grade temp

## 2018-02-23 ENCOUNTER — Emergency Department (HOSPITAL_COMMUNITY)
Admission: EM | Admit: 2018-02-23 | Discharge: 2018-02-23 | Disposition: A | Discharge status: Home / Self Care | Payer: Medicaid Other | Attending: Emergency Medicine | Admitting: Emergency Medicine

## 2018-02-23 ENCOUNTER — Emergency Department (HOSPITAL_COMMUNITY): Payer: Medicaid Other

## 2018-02-23 DIAGNOSIS — F17210 Nicotine dependence, cigarettes, uncomplicated: Secondary | ICD-10-CM | POA: Insufficient documentation

## 2018-02-23 DIAGNOSIS — N200 Calculus of kidney: Secondary | ICD-10-CM

## 2018-02-23 DIAGNOSIS — R109 Unspecified abdominal pain: Secondary | ICD-10-CM | POA: Diagnosis present

## 2018-02-23 LAB — COMPREHENSIVE METABOLIC PANEL
ALBUMIN: 3.9 g/dL (ref 3.5–5.0)
ALT: 24 U/L (ref 0–44)
ANION GAP: 8 (ref 5–15)
AST: 20 U/L (ref 15–41)
Alkaline Phosphatase: 80 U/L (ref 38–126)
BUN: 20 mg/dL (ref 6–20)
CHLORIDE: 108 mmol/L (ref 98–111)
CO2: 22 mmol/L (ref 22–32)
CREATININE: 0.88 mg/dL (ref 0.61–1.24)
Calcium: 9.1 mg/dL (ref 8.9–10.3)
GFR calc non Af Amer: >60 mL/min (ref >60)
Glucose, Bld: 131 mg/dL — ABNORMAL HIGH (ref 70–99)
Potassium: 3.8 mmol/L (ref 3.5–5.1)
Sodium: 138 mmol/L (ref 135–145)
Total Bilirubin: 0.7 mg/dL (ref 0.3–1.2)
Total Protein: 7.8 g/dL (ref 6.5–8.1)

## 2018-02-23 LAB — CBC WITH DIFFERENTIAL/PLATELET
Abs Immature Granulocytes: 0.03 10*3/uL (ref 0.00–0.07)
Basophils Absolute: 0.1 10*3/uL (ref 0.0–0.1)
Basophils Relative: 1 %
EOS ABS: 0.2 10*3/uL (ref 0.0–0.5)
Eosinophils Relative: 2 %
HEMATOCRIT: 41.3 % (ref 39.0–52.0)
Hemoglobin: 12.7 g/dL — ABNORMAL LOW (ref 13.0–17.0)
IMMATURE GRANULOCYTES: 0 %
LYMPHS ABS: 3 10*3/uL (ref 0.7–4.0)
Lymphocytes Relative: 27 %
MCH: 28 pg (ref 26.0–34.0)
MCHC: 30.8 g/dL (ref 30.0–36.0)
MCV: 91.2 fL (ref 80.0–100.0)
MONOS PCT: 7 %
Monocytes Absolute: 0.8 10*3/uL (ref 0.1–1.0)
NEUTROS ABS: 7 10*3/uL (ref 1.7–7.7)
NEUTROS PCT: 63 %
PLATELETS: 276 10*3/uL (ref 150–400)
RBC: 4.53 MIL/uL (ref 4.22–5.81)
RDW: 13.8 % (ref 11.5–15.5)
WBC: 11.1 10*3/uL — ABNORMAL HIGH (ref 4.0–10.5)
nRBC: 0 % (ref 0.0–0.2)

## 2018-02-23 LAB — URINALYSIS, ROUTINE W REFLEX MICROSCOPIC
Bilirubin Urine: NEGATIVE
Glucose, UA: NEGATIVE mg/dL
Ketones, ur: NEGATIVE mg/dL
Leukocytes, UA: NEGATIVE
NITRITE: NEGATIVE
Protein, ur: NEGATIVE mg/dL
SPECIFIC GRAVITY, URINE: 1.019 (ref 1.005–1.030)
pH: 7 (ref 5.0–8.0)

## 2018-02-23 LAB — LIPASE, BLOOD: LIPASE: 25 U/L (ref 11–51)

## 2018-02-23 MED ORDER — ONDANSETRON 4 MG PO TBDP: 4.0000 mg | ORAL_TABLET | Freq: Three times a day (TID) | ORAL | 0 refills | Status: AC | PRN

## 2018-02-23 MED ORDER — OXYCODONE HCL 5 MG PO TABS: 5.0000 mg | ORAL_TABLET | ORAL | 0 refills | Status: AC | PRN

## 2018-02-23 MED ORDER — MORPHINE SULFATE (PF) 4 MG/ML IV SOLN
4.0000 mg | Freq: Once | INTRAVENOUS | Status: AC
  Administered 2018-02-23: 4 mg via INTRAVENOUS
  Filled 2018-02-23: qty 1

## 2018-02-23 MED ORDER — SODIUM CHLORIDE 0.9 % IV BOLUS
1000.0000 mL | Freq: Once | INTRAVENOUS | Status: AC
  Administered 2018-02-23: 1000 mL via INTRAVENOUS

## 2018-02-23 MED ORDER — HYDROMORPHONE HCL 1 MG/ML IJ SOLN
1.0000 mg | Freq: Once | INTRAMUSCULAR | Status: AC
  Administered 2018-02-23: 1 mg via INTRAVENOUS
  Filled 2018-02-23: qty 1

## 2018-02-23 MED ORDER — KETOROLAC TROMETHAMINE 30 MG/ML IJ SOLN
30.0000 mg | Freq: Once | INTRAMUSCULAR | Status: AC
  Administered 2018-02-23: 30 mg via INTRAVENOUS
  Filled 2018-02-23: qty 1

## 2018-02-23 MED ORDER — ONDANSETRON HCL 4 MG/2ML IJ SOLN
4.0000 mg | Freq: Once | INTRAMUSCULAR | Status: AC
  Administered 2018-02-23: 4 mg via INTRAVENOUS
  Filled 2018-02-23: qty 2

## 2018-02-23 MED ORDER — TAMSULOSIN HCL 0.4 MG PO CAPS: 0.4000 mg | ORAL_CAPSULE | Freq: Every day | ORAL | 0 refills | Status: AC

## 2018-02-23 NOTE — ED Triage Notes (Signed)
Patient c/o RLQ/flank pain onset 2 days ago, worse this morning with hematuria and N/V x 1 today. Denies fevers/chills.

## 2018-02-23 NOTE — ED Notes (Signed)
Family at bedside requesting something else for pain for patient. MD aware.

## 2018-02-23 NOTE — Discharge Instructions (Signed)
Take Tylenol 1000 mg 4 times a day for 1 week. This is the maximum dose of Tylenol (acetaminophen) you can take from all sources. Please check other over-the-counter medications and prescriptions to ensure you are not taking other medications that contain acetaminophen.  You may also take ibuprofen 400 mg 6 times a day alternating with or at the same time as tylenol.  Take oxycodone as needed for breakthrough pain.  This medication can be addicting, sedating and cause constipation.   °

## 2018-02-23 NOTE — ED Notes (Signed)
Patient transported to CT 

## 2018-02-23 NOTE — ED Notes (Signed)
Pt still complaining of pain and nausea.  Pt provided an emesis bag and vomited.

## 2018-02-23 NOTE — ED Notes (Signed)
Patient called out, requesting something else for pain. MD aware, no new orders at this time.

## 2018-02-23 NOTE — ED Notes (Signed)
Patient verbalizes understanding of discharge instructions. Opportunity for questioning and answers were provided. Armband removed by staff, pt discharged from ED.  

## 2018-02-24 NOTE — ED Provider Notes (Signed)
[facility]   CSN: [id number] Arrival date & time: 02/23/18  0809     History   Chief Complaint Chief Complaint  Patient presents with  . Abdominal Pain    HPI Christopher Mcmahon is a 38 y.o. male.  HPI  Presents with concern for right flank pain. Had episode 2 days ago that came and resolved sponataneously however this morning woke up with severe pain which has continued. Severe sharp right flank pain with radiation to suprapubic area.  Past Medical History:  Diagnosis Date  . Shoulder dislocation, recurrent, right     There are no active problems to display for this patient.   No past surgical history on file.      Home Medications    Prior to Admission medications   Medication Sig Start Date End Date Taking? Authorizing Provider  albuterol (PROVENTIL HFA;VENTOLIN HFA) 108 (90 Base) MCG/ACT inhaler Inhale 2 puffs into the lungs every 4 (four) hours as needed for wheezing or shortness of breath. Patient not taking: Reported on 02/23/2018 04/15/17   Horton, Mayer Maskerourtney F, MD  HYDROcodone-acetaminophen (NORCO/VICODIN) 5-325 MG tablet Take 1 tablet by mouth every 6 (six) hours as needed for severe pain. Patient not taking: Reported on 02/23/2018 07/31/17   Zadie RhineWickline, Donald, MD  ondansetron (ZOFRAN ODT) 4 MG disintegrating tablet Take 1 tablet (4 mg total) by mouth every 8 (eight) hours as needed for nausea or vomiting. 02/23/18   Alvira MondaySchlossman, Bosten Newstrom, MD  ondansetron (ZOFRAN) 4 MG tablet Take 1 tablet (4 mg total) by mouth every 6 (six) hours. Patient not taking: Reported on 02/23/2018 01/28/18   Maxwell CaulLayden, Lindsey A, PA-C  oseltamivir (TAMIFLU) 75 MG capsule Take 1 capsule (75 mg total) by mouth every 12 (twelve) hours. Patient not taking: Reported on 02/23/2018 01/28/18   Graciella FreerLayden, Lindsey A, PA-C  oxyCODONE (ROXICODONE) 5 MG immediate release tablet Take 1 tablet (5 mg total) by mouth every 4 (four) hours as needed for severe pain. 02/23/18    Alvira MondaySchlossman, Yeraldin Litzenberger, MD  tamsulosin (FLOMAX) 0.4 MG CAPS capsule Take 1 capsule (0.4 mg total) by mouth daily for 14 days. 02/23/18 03/09/18  Alvira MondaySchlossman, Suheily Birks, MD    Family History No family history on file.  Social History Social History   Tobacco Use  . Smoking status: Current Every Day Smoker    Packs/day: 0.00    Types: Cigarettes, Cigars  . Smokeless tobacco: Never Used  Substance Use Topics  . Alcohol use: Yes    Comment: occ  . Drug use: No     Allergies   Patient has no known allergies.   Review of Systems Review of Systems  Constitutional: Negative for fever.  HENT: Negative for sore throat.   Eyes: Negative for visual disturbance.  Respiratory: Negative for shortness of breath.   Cardiovascular: Negative for chest pain.  Gastrointestinal: Positive for nausea and vomiting. Negative for abdominal pain, constipation and diarrhea.  Genitourinary: Positive for flank pain. Negative for difficulty urinating and dysuria.  Musculoskeletal: Negative for back pain and neck stiffness.  Skin: Negative for rash.  Neurological: Negative for syncope and headaches.     Physical Exam Updated Vital Signs BP 126/83 (BP Location: Right Arm)   Pulse 80   Temp (!) 97.5 F (36.4 C) (Oral)   Resp 16   SpO2 100%   Physical Exam Vitals signs and nursing note reviewed.  Constitutional:      General: He is not in acute distress.    Appearance:  He is well-developed. He is not diaphoretic.  HENT:     Head: Normocephalic and atraumatic.  Eyes:     Conjunctiva/sclera: Conjunctivae normal.  Neck:     Musculoskeletal: Normal range of motion.  Cardiovascular:     Rate and Rhythm: Normal rate and regular rhythm.     Heart sounds: Normal heart sounds. No murmur. No friction rub. No gallop.   Pulmonary:     Effort: Pulmonary effort is normal. No respiratory distress.     Breath sounds: Normal breath sounds. No wheezing or rales.  Abdominal:     General: There is no distension.      Palpations: Abdomen is soft.     Tenderness: There is no abdominal tenderness. There is right CVA tenderness. There is no guarding.  Skin:    General: Skin is warm and dry.  Neurological:     Mental Status: He is alert and oriented to person, place, and time.      ED Treatments / Results  Labs (all labs ordered are listed, but only abnormal results are displayed) Labs Reviewed  CBC WITH DIFFERENTIAL/PLATELET - Abnormal; Notable for the following components:      Result Value   WBC 11.1 (*)    Hemoglobin 12.7 (*)    All other components within normal limits  COMPREHENSIVE METABOLIC PANEL - Abnormal; Notable for the following components:   Glucose, Bld 131 (*)    All other components within normal limits  URINALYSIS, ROUTINE W REFLEX MICROSCOPIC - Abnormal; Notable for the following components:   APPearance HAZY (*)    Hgb urine dipstick SMALL (*)    Bacteria, UA RARE (*)    All other components within normal limits  LIPASE, BLOOD    EKG None  Radiology Ct Renal Stone Study  Result Date: 02/23/2018 CLINICAL DATA:  Right lower quadrant and right flank pain for 2 days with nausea and vomiting. Hematuria. EXAM: CT ABDOMEN AND PELVIS WITHOUT CONTRAST TECHNIQUE: Multidetector CT imaging of the abdomen and pelvis was performed following the standard protocol without IV contrast. COMPARISON:  None. FINDINGS: Lower chest: Normal. Hepatobiliary: No focal liver abnormality is seen. No gallstones, gallbladder wall thickening, or biliary dilatation. Pancreas: Unremarkable. No pancreatic ductal dilatation or surrounding inflammatory changes. Spleen: Normal in size without focal abnormality. Adrenals/Urinary Tract: There is a 4 mm stone in the right side of the bladder near the right ureteral vesicle junction. There is slight distention of the distal right ureter. Proximal right ureter and renal pelvis and calices of the right kidney are normal. Left kidney is normal.  Adrenal glands are normal.   No renal stones. Stomach/Bowel: Stomach is within normal limits. There is a fecalith in the otherwise normal appearing appendix. No evidence of bowel wall thickening, distention, or inflammatory changes. Vascular/Lymphatic: No significant vascular findings are present. No enlarged abdominal or pelvic lymph nodes. Reproductive: Prostate is unremarkable. Other: No abdominal wall hernia or abnormality. No abdominopelvic ascites. Musculoskeletal: No acute or significant osseous findings. IMPRESSION: 1. 4 mm stone in the right posterolateral aspect of the bladder consistent with a recently passed right ureteral stone. 2. Minimal dilatation of the distal right ureter consistent with recent passage of a ureteral stone. 3. Otherwise, benign appearing abdomen. Electronically Signed   By: Francene Boyers M.D.   On: 02/23/2018 09:30    Procedures Procedures (including critical care time)  Medications Ordered in ED Medications  ketorolac (TORADOL) 30 MG/ML injection 30 mg (30 mg Intravenous Given 02/23/18 0822)  ondansetron (ZOFRAN)  injection 4 mg (4 mg Intravenous Given 02/23/18 9030)  sodium chloride 0.9 % bolus 1,000 mL (0 mLs Intravenous Stopped 02/23/18 0937)  morphine 4 MG/ML injection 4 mg (4 mg Intravenous Given 02/23/18 0901)  HYDROmorphone (DILAUDID) injection 1 mg (1 mg Intravenous Given 02/23/18 1122)     Initial Impression / Assessment and Plan / ED Course  I have reviewed the triage vital signs and the nursing notes.  Pertinent labs & imaging results that were available during my care of the patient were reviewed by me and considered in my medical decision making (see chart for details).  38yo male presents with right flank pain. No sign of cholecystitis, pancreatitis, hepatitis. CT stone study shows signs of stone in bladder, and right hydronephrosis, likely recently passed stone.  Pt with continuing pain but well controlled. Suspect recent passage of stone with continuing ureteral spasm. Normal  renal function. No UTI.   Reveiewed in drug database and gave short rx for oxycodone after discussion of risks, as well as recommendation for ibuprofen, tylenol, flomax. Patient discharged in stable condition with understanding of reasons to return.   Final Clinical Impressions(s) / ED Diagnoses   Final diagnoses:  Nephrolithiasis    ED Discharge Orders         Ordered    tamsulosin (FLOMAX) 0.4 MG CAPS capsule  Daily     02/23/18 1328    oxyCODONE (ROXICODONE) 5 MG immediate release tablet  Every 4 hours PRN     02/23/18 1328    ondansetron (ZOFRAN ODT) 4 MG disintegrating tablet  Every 8 hours PRN     02/23/18 1330           Alvira Monday, MD 02/24/18 2049

## 2018-10-05 ENCOUNTER — Emergency Department (HOSPITAL_COMMUNITY)
Admission: EM | Admit: 2018-10-05 | Discharge: 2018-10-05 | Disposition: A | Discharge status: Home / Self Care | Payer: Medicaid Other | Attending: Emergency Medicine | Admitting: Emergency Medicine

## 2018-10-05 ENCOUNTER — Encounter (HOSPITAL_COMMUNITY): Payer: Self-pay | Admitting: Emergency Medicine

## 2018-10-05 ENCOUNTER — Emergency Department (HOSPITAL_COMMUNITY): Payer: Medicaid Other

## 2018-10-05 ENCOUNTER — Other Ambulatory Visit: Payer: Self-pay

## 2018-10-05 DIAGNOSIS — Y929 Unspecified place or not applicable: Secondary | ICD-10-CM | POA: Insufficient documentation

## 2018-10-05 DIAGNOSIS — W230XXA Caught, crushed, jammed, or pinched between moving objects, initial encounter: Secondary | ICD-10-CM | POA: Insufficient documentation

## 2018-10-05 DIAGNOSIS — S63613A Unspecified sprain of left middle finger, initial encounter: Secondary | ICD-10-CM | POA: Diagnosis not present

## 2018-10-05 DIAGNOSIS — Y9389 Activity, other specified: Secondary | ICD-10-CM | POA: Insufficient documentation

## 2018-10-05 DIAGNOSIS — Y999 Unspecified external cause status: Secondary | ICD-10-CM | POA: Diagnosis not present

## 2018-10-05 DIAGNOSIS — F17210 Nicotine dependence, cigarettes, uncomplicated: Secondary | ICD-10-CM | POA: Insufficient documentation

## 2018-10-05 DIAGNOSIS — S6992XA Unspecified injury of left wrist, hand and finger(s), initial encounter: Secondary | ICD-10-CM | POA: Diagnosis present

## 2018-10-05 MED ORDER — OXYCODONE-ACETAMINOPHEN 5-325 MG PO TABS
1.0000 | ORAL_TABLET | Freq: Once | ORAL | Status: AC
  Administered 2018-10-05: 1 via ORAL
  Filled 2018-10-05: qty 1

## 2018-10-05 NOTE — ED Provider Notes (Signed)
[facility] EMERGENCY DEPARTMENT Provider Note   CSN: [id number] Arrival date & time: 10/05/18  1141     History   Chief Complaint No chief complaint on file.   HPI Christopher Mcmahon is a 38 y.o. male.     HPI   38 year old male presents today with complaints of left middle finger pain.  Patient notes that he was putting the collar on his dog when the dog wanted to play and started to roll.  He notes his finger got caught in the collar causing pain and a popping sensation in his finger.  He notes difficulty with flexion extension at the DIP PIP secondary to discomfort.  No open wounds, no loss of sensation, remainder hand nontender  Past Medical History:  Diagnosis Date  . Shoulder dislocation, recurrent, right     There are no active problems to display for this patient.   History reviewed. No pertinent surgical history.      Home Medications    Prior to Admission medications   Medication Sig Start Date End Date Taking? Authorizing Provider  albuterol (PROVENTIL HFA;VENTOLIN HFA) 108 (90 Base) MCG/ACT inhaler Inhale 2 puffs into the lungs every 4 (four) hours as needed for wheezing or shortness of breath. Patient not taking: Reported on 02/23/2018 04/15/17   Horton, Barbette Hair, MD  HYDROcodone-acetaminophen (NORCO/VICODIN) 5-325 MG tablet Take 1 tablet by mouth every 6 (six) hours as needed for severe pain. Patient not taking: Reported on 02/23/2018 07/31/17   Ripley Fraise, MD  ondansetron (ZOFRAN ODT) 4 MG disintegrating tablet Take 1 tablet (4 mg total) by mouth every 8 (eight) hours as needed for nausea or vomiting. Patient not taking: Reported on 10/05/2018 02/23/18   Gareth Morgan, MD  ondansetron (ZOFRAN) 4 MG tablet Take 1 tablet (4 mg total) by mouth every 6 (six) hours. Patient not taking: Reported on 02/23/2018 01/28/18   Volanda Napoleon, PA-C  oseltamivir (TAMIFLU) 75 MG capsule Take 1 capsule (75 mg total) by mouth every 12 (twelve) hours.  Patient not taking: Reported on 10/05/2018 01/28/18   Providence Lanius A, PA-C  oxyCODONE (ROXICODONE) 5 MG immediate release tablet Take 1 tablet (5 mg total) by mouth every 4 (four) hours as needed for severe pain. Patient not taking: Reported on 10/05/2018 02/23/18   Gareth Morgan, MD    Family History No family history on file.  Social History Social History   Tobacco Use  . Smoking status: Current Every Day Smoker    Packs/day: 0.00    Types: Cigarettes, Cigars  . Smokeless tobacco: Never Used  Substance Use Topics  . Alcohol use: Yes    Comment: occ  . Drug use: No     Allergies   Patient has no known allergies.   Review of Systems Review of Systems  All other systems reviewed and are negative.    Physical Exam Updated Vital Signs BP (!) 128/92 (BP Location: Right Arm)   Pulse 65   Temp 97.7 F (36.5 C) (Oral)   Resp 20   SpO2 100%   Physical Exam Vitals signs and nursing note reviewed.  Constitutional:      Appearance: He is well-developed.  HENT:     Head: Normocephalic and atraumatic.  Eyes:     General: No scleral icterus.       Right eye: No discharge.        Left eye: No discharge.     Conjunctiva/sclera: Conjunctivae normal.     Pupils: Pupils are  equal, round, and reactive to light.  Neck:     Musculoskeletal: Normal range of motion.     Vascular: No JVD.     Trachea: No tracheal deviation.  Pulmonary:     Effort: Pulmonary effort is normal.     Breath sounds: No stridor.  Musculoskeletal:     Comments: Swelling noted to the left middle finger from the MCP through the DIP, tenderness throughout, no obvious deformity, sensation intact, no wounds-PIP and DIP and semi-flex position unable to range secondary to discomfort  Neurological:     Mental Status: He is alert and oriented to person, place, and time.     Coordination: Coordination normal.  Psychiatric:        Behavior: Behavior normal.        Thought Content: Thought content normal.         Judgment: Judgment normal.      ED Treatments / Results  Labs (all labs ordered are listed, but only abnormal results are displayed) Labs Reviewed - No data to display  EKG None  Radiology Dg Finger Middle Left  Result Date: 10/05/2018 CLINICAL DATA:  Pt got left middle finger caught in dog collar and twisted around yesterday - having pain around both PIP and DIP joint areas since EXAM: LEFT MIDDLE FINGER 2+V COMPARISON:  None. FINDINGS: There is no evidence of fracture or dislocation. There is no evidence of arthropathy or other focal bone abnormality. There is diffuse soft tissue swelling in the distal aspect of the finger. IMPRESSION: No acute osseous abnormality in the left middle finger. Electronically Signed   By: Emmaline KluverNancy  Ballantyne M.D.   On: 10/05/2018 13:28    Procedures Procedures (including critical care time)  Medications Ordered in ED Medications  oxyCODONE-acetaminophen (PERCOCET/ROXICET) 5-325 MG per tablet 1 tablet (1 tablet Oral Given 10/05/18 1340)     Initial Impression / Assessment and Plan / ED Course  I have reviewed the triage vital signs and the nursing notes.  Pertinent labs & imaging results that were available during my care of the patient were reviewed by me and considered in my medical decision making (see chart for details).        38 year old male presents today with finger sprain.  He is unable to range the DIP and PIP I find it less likely he suffered damage to the tendon, locally ligamentous injury.  No obvious deformity, no bony abnormality.  Patient will be splinted given rice instructions if symptoms do not improve and he has difficulty ranging The joints he will follow-up as an outpatient with hand specialty.  Return precautions given.  Verbalized understanding and agreement to today's plan.  Final Clinical Impressions(s) / ED Diagnoses   Final diagnoses:  Sprain of left middle finger, unspecified site of finger, initial encounter     ED Discharge Orders    None       Eyvonne MechanicHedges, Deakon Frix, PA-C 10/05/18 1403    Milagros Lollykstra, Richard S, MD 10/06/18 [phone]

## 2018-10-05 NOTE — Discharge Instructions (Addendum)
Please read attached information. If you experience any new or worsening signs or symptoms please return to the emergency room for evaluation. Please follow-up with your primary care provider or specialist as discussed.  °

## 2018-10-05 NOTE — ED Triage Notes (Signed)
Pt got his left hand caught in the chain while walking his dog, middle finger on left hand is swollen and painful to move

## 2019-09-23 IMAGING — DX DG FINGER MIDDLE 2+V*L*
3 series · 3 of 3 positions shown · non-contrast
Comparison: None.

CLINICAL DATA: Pt got left middle finger caught in dog collar and
twisted around yesterday - having pain around both PIP and DIP joint
areas since

EXAM:
LEFT MIDDLE FINGER 2+V

[finger ap]
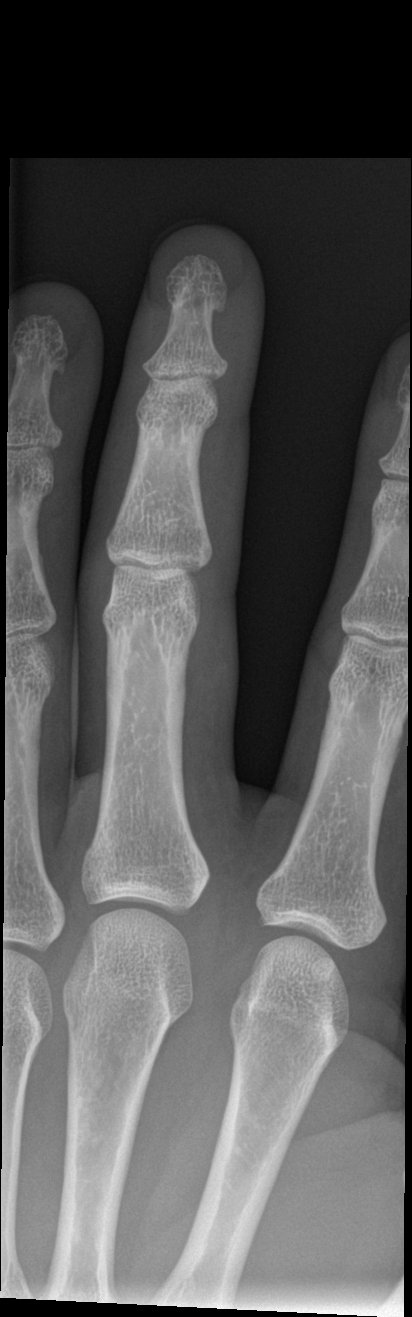

[finger obl]
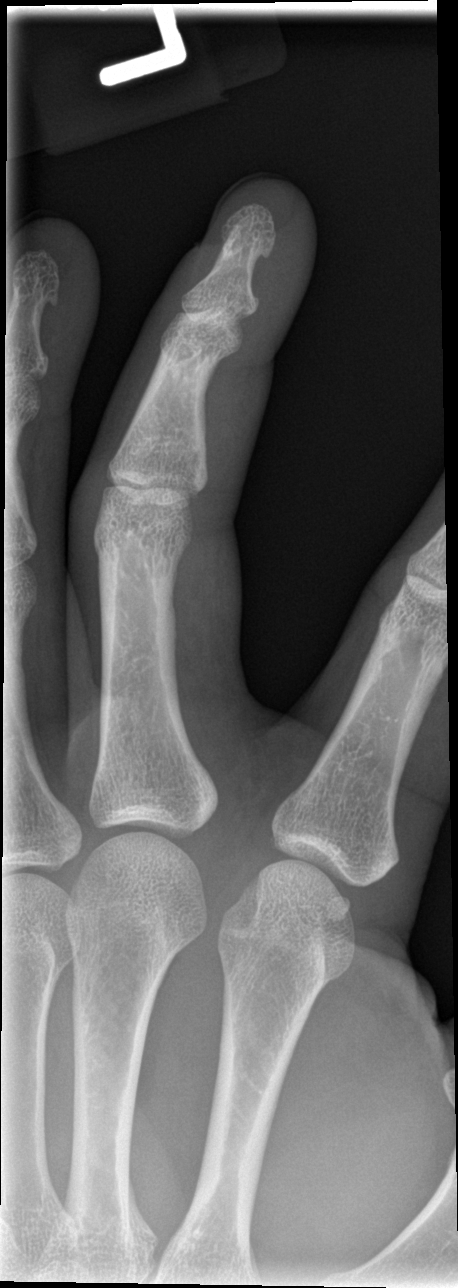

[finger lat]
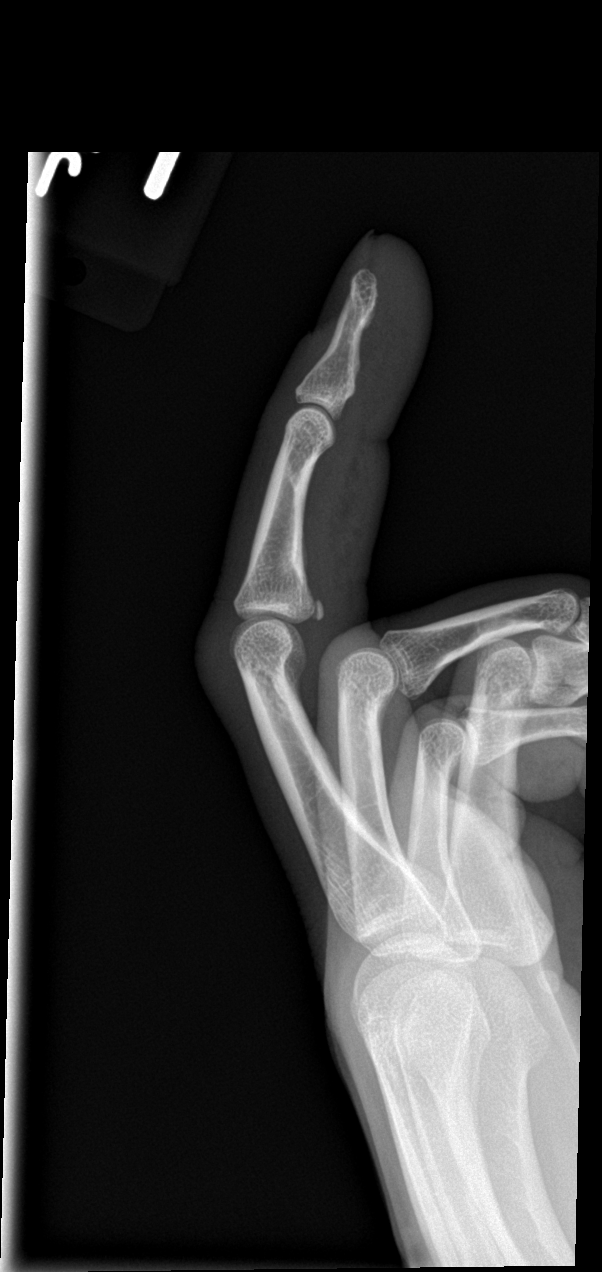

[3 of 3 positions shown; findings below may reference images not displayed]

FINDINGS: There is no evidence of fracture or dislocation. There is no
evidence of arthropathy or other focal bone abnormality. There is
diffuse soft tissue swelling in the distal aspect of the finger.
IMPRESSION: No acute osseous abnormality in the left middle finger.

## 2021-05-16 ENCOUNTER — Other Ambulatory Visit: Payer: Self-pay

## 2021-05-16 ENCOUNTER — Emergency Department (HOSPITAL_COMMUNITY)
Admission: EM | Admit: 2021-05-16 | Discharge: 2021-05-16 | Disposition: A | Discharge status: Home / Self Care | Payer: Medicaid Other | Attending: Emergency Medicine | Admitting: Emergency Medicine

## 2021-05-16 ENCOUNTER — Encounter (HOSPITAL_COMMUNITY): Payer: Self-pay | Admitting: Emergency Medicine

## 2021-05-16 ENCOUNTER — Emergency Department (HOSPITAL_COMMUNITY): Payer: Medicaid Other

## 2021-05-16 DIAGNOSIS — R079 Chest pain, unspecified: Secondary | ICD-10-CM | POA: Insufficient documentation

## 2021-05-16 DIAGNOSIS — F17200 Nicotine dependence, unspecified, uncomplicated: Secondary | ICD-10-CM | POA: Insufficient documentation

## 2021-05-16 DIAGNOSIS — R110 Nausea: Secondary | ICD-10-CM | POA: Insufficient documentation

## 2021-05-16 LAB — BASIC METABOLIC PANEL
Anion gap: 5 (ref 5–15)
BUN: 20 mg/dL (ref 6–20)
CO2: 24 mmol/L (ref 22–32)
Calcium: 9 mg/dL (ref 8.9–10.3)
Chloride: 111 mmol/L (ref 98–111)
Creatinine, Ser: 0.92 mg/dL (ref 0.61–1.24)
GFR, Estimated: >60 mL/min (ref >60)
Glucose, Bld: 86 mg/dL (ref 70–99)
Potassium: 4.2 mmol/L (ref 3.5–5.1)
Sodium: 140 mmol/L (ref 135–145)

## 2021-05-16 LAB — TROPONIN I (HIGH SENSITIVITY)
Troponin I (High Sensitivity): 5 ng/L (ref <18)
Troponin I (High Sensitivity): 4 ng/L (ref <18)

## 2021-05-16 LAB — CBC
HCT: 43.6 % (ref 39.0–52.0)
Hemoglobin: 14.4 g/dL (ref 13.0–17.0)
MCH: 30 pg (ref 26.0–34.0)
MCHC: 33 g/dL (ref 30.0–36.0)
MCV: 90.8 fL (ref 80.0–100.0)
Platelets: 239 10*3/uL (ref 150–400)
RBC: 4.8 MIL/uL (ref 4.22–5.81)
RDW: 13.5 % (ref 11.5–15.5)
WBC: 10.7 10*3/uL — ABNORMAL HIGH (ref 4.0–10.5)
nRBC: 0 % (ref 0.0–0.2)

## 2021-05-16 MED ORDER — NAPROXEN 375 MG PO TABS: 375.0000 mg | ORAL_TABLET | Freq: Two times a day (BID) | ORAL | 0 refills | Status: DC

## 2021-05-16 MED ORDER — IBUPROFEN 400 MG PO TABS
600.0000 mg | ORAL_TABLET | Freq: Once | ORAL | Status: AC
  Administered 2021-05-16: 600 mg via ORAL
  Filled 2021-05-16: qty 1

## 2021-05-16 NOTE — ED Provider Notes (Signed)
?[facility] EMERGENCY DEPARTMENT ?Provider Note ? ? ?CSN: [id number] ?Arrival date & time: 05/16/21  1516 ? ?  ? ?History ? ?Chief Complaint  ?Patient presents with  ? Chest Pain  ? ? ?Christopher Mcmahon is a 41 y.o. male. ? ? ?Chest Pain ? ?HPI: A 41 year old patient presents for evaluation of chest pain. Initial onset of pain was more than 6 hours ago. The patient's chest pain is described as heaviness/pressure/tightness and is worse with exertion. The patient complains of nausea. The patient's chest pain is not middle- or left-sided, is not well-localized, is not sharp and does not radiate to the arms/jaw/neck. The patient denies diaphoresis. The patient has smoked in the past 90 days and has a family history of coronary artery disease in a first-degree relative with onset less than age 25. The patient has no history of stroke, has no history of peripheral artery disease, denies any history of treated diabetes, is not hypertensive, has no history of hypercholesterolemia and does not have an elevated BMI (>=30).  ? ?Home Medications ?Prior to Admission medications   ?Medication Sig Start Date End Date Taking? Authorizing Provider  ?naproxen (NAPROSYN) 375 MG tablet Take 1 tablet (375 mg total) by mouth 2 (two) times daily. 05/16/21  Yes Linwood Dibbles, MD  ?albuterol (PROVENTIL HFA;VENTOLIN HFA) 108 (90 Base) MCG/ACT inhaler Inhale 2 puffs into the lungs every 4 (four) hours as needed for wheezing or shortness of breath. ?Patient not taking: Reported on 02/23/2018 04/15/17   Horton, Mayer Masker, MD  ?HYDROcodone-acetaminophen (NORCO/VICODIN) 5-325 MG tablet Take 1 tablet by mouth every 6 (six) hours as needed for severe pain. ?Patient not taking: Reported on 02/23/2018 07/31/17   Zadie Rhine, MD  ?ondansetron (ZOFRAN ODT) 4 MG disintegrating tablet Take 1 tablet (4 mg total) by mouth every 8 (eight) hours as needed for nausea or vomiting. ?Patient not taking: Reported on 10/05/2018 02/23/18   Alvira Monday,  MD  ?ondansetron (ZOFRAN) 4 MG tablet Take 1 tablet (4 mg total) by mouth every 6 (six) hours. ?Patient not taking: Reported on 02/23/2018 01/28/18   Graciella Freer A, PA-C  ?oseltamivir (TAMIFLU) 75 MG capsule Take 1 capsule (75 mg total) by mouth every 12 (twelve) hours. ?Patient not taking: Reported on 10/05/2018 01/28/18   Graciella Freer A, PA-C  ?oxyCODONE (ROXICODONE) 5 MG immediate release tablet Take 1 tablet (5 mg total) by mouth every 4 (four) hours as needed for severe pain. ?Patient not taking: Reported on 10/05/2018 02/23/18   Alvira Monday, MD  ?   ? ?Allergies    ?Patient has no known allergies.   ? ?Review of Systems   ?Review of Systems  ?Cardiovascular:  Positive for chest pain.  ?All other systems reviewed and are negative. ? ?Physical Exam ?Updated Vital Signs ?BP 116/79   Pulse (!) 59   Temp 98.2 ?F (36.8 ?C) (Oral)   Resp 18   Ht 1.702 m (5\' 7" )   Wt 74.8 kg   SpO2 95%   BMI 25.84 kg/m?  ?Physical Exam ?Vitals and nursing note reviewed.  ?Constitutional:   ?   General: He is not in acute distress. ?   Appearance: He is well-developed.  ?HENT:  ?   Head: Normocephalic and atraumatic.  ?   Right Ear: External ear normal.  ?   Left Ear: External ear normal.  ?Eyes:  ?   General: No scleral icterus.    ?   Right eye: No discharge.     ?  Left eye: No discharge.  ?   Conjunctiva/sclera: Conjunctivae normal.  ?Neck:  ?   Trachea: No tracheal deviation.  ?Cardiovascular:  ?   Rate and Rhythm: Normal rate and regular rhythm.  ?Pulmonary:  ?   Effort: Pulmonary effort is normal. No respiratory distress.  ?   Breath sounds: Normal breath sounds. No stridor. No wheezing or rales.  ?Abdominal:  ?   General: Bowel sounds are normal. There is no distension.  ?   Palpations: Abdomen is soft.  ?   Tenderness: There is no abdominal tenderness. There is no guarding or rebound.  ?Musculoskeletal:     ?   General: No tenderness or deformity.  ?   Cervical back: Neck supple.  ?Skin: ?   General: Skin is warm and  dry.  ?   Findings: No rash.  ?Neurological:  ?   General: No focal deficit present.  ?   Mental Status: He is alert.  ?   Cranial Nerves: No cranial nerve deficit (no facial droop, extraocular movements intact, no slurred speech).  ?   Sensory: No sensory deficit.  ?   Motor: No abnormal muscle tone or seizure activity.  ?   Coordination: Coordination normal.  ?Psychiatric:     ?   Mood and Affect: Mood normal.  ? ? ?ED Results / Procedures / Treatments   ?Labs ?(all labs ordered are listed, but only abnormal results are displayed) ?Labs Reviewed  ?CBC - Abnormal; Notable for the following components:  ?    Result Value  ? WBC 10.7 (*)   ? All other components within normal limits  ?BASIC METABOLIC PANEL  ?TROPONIN I (HIGH SENSITIVITY)  ?TROPONIN I (HIGH SENSITIVITY)  ? ? ?EKG ?EKG Interpretation ? ?Date/Time:  Wednesday May 16 2021 15:21:40 EDT ?Ventricular Rate:  93 ?PR Interval:  120 ?QRS Duration: 88 ?QT Interval:  332 ?QTC Calculation: 412 ?R Axis:   85 ?Text Interpretation: Normal sinus rhythm with sinus arrhythmia Right atrial enlargement Minimal voltage criteria for LVH, may be normal variant ( Sokolow-Lyon ) Borderline ECG When compared with ECG of 14-Apr-2017 21:26, No significant change since last tracing Confirmed by Linwood Dibbles [phone]) on 05/16/2021 5:43:21 PM ? ?Radiology ?DG Chest 2 View ? ?Result Date: 05/16/2021 ?CLINICAL DATA:  Chest pain and pressure EXAM: CHEST - 2 VIEW COMPARISON:  01/28/2018 FINDINGS: The heart size and mediastinal contours are within normal limits. Both lungs are clear. EKG lead projects over the right upper lobe laterally. The visualized skeletal structures are unremarkable. IMPRESSION: No active cardiopulmonary disease. Electronically Signed   By: Jeronimo Greaves M.D.   On: 05/16/2021 16:31   ? ?Procedures ?Procedures  ? ? ?Medications Ordered in ED ?Medications  ?ibuprofen (ADVIL) tablet 600 mg (600 mg Oral Given 05/16/21 1752)  ? ? ?ED Course/ Medical Decision Making/ A&P ?   ?HEAR Score: 3                       ?Medical Decision Making ?Amount and/or Complexity of Data Reviewed ?Labs: ordered. ?Radiology: ordered. ? ?Risk ?Prescription drug management. ? ? ?Patient presented to ED for evaluation of chest pain.  There is a family history but patient does not have any history of heart disease himself.  Low risk for PE, PERC negative.  Symptoms ongoing for over a day.  Fortunately ED work-up is reassuring.  No signs of acute cardiac ischemia.  EKG and troponins are normal.  Low risk heart score.  Doubt  ACS at this time and I feel patient is appropriate for outpatient follow-up. ? ?Evaluation and diagnostic testing in the emergency department does not suggest an emergent condition requiring admission or immediate intervention beyond what has been performed at this time.  The patient is safe for discharge and has been instructed to return immediately for worsening symptoms, change in symptoms or any other concerns. ? ? ? ? ? ? ? ? ?Final Clinical Impression(s) / ED Diagnoses ?Final diagnoses:  ?Chest pain, unspecified type  ? ? ?Rx / DC Orders ?ED Discharge Orders   ? ?      Ordered  ?  naproxen (NAPROSYN) 375 MG tablet  2 times daily       ? 05/16/21 1907  ? ?  ?  ? ?  ? ? ?  ?Linwood DibblesKnapp, Quandre Polinski, MD ?05/16/21 1908 ? ?

## 2021-05-16 NOTE — ED Provider Triage Note (Signed)
Emergency Medicine Provider Triage Evaluation Note ? ?Darletta Moll , a 41 y.o. male  was evaluated in triage.  Pt complains of chest pain that started yesterday.  Says it has not gone away since then.  Also says he has had a headache.  Took Aleve for this last night and symptoms did not improve.  Says he has "a little bit of shortness of breath." ? ?No history of ACS or hypertension.  Says he is prediabetic. ? ?Review of Systems  ?Positive: Chest pain and headache ?Negative:  ? ?Physical Exam  ?BP (!) 132/101 (BP Location: Right Arm)   Pulse 91   Temp 98.2 ?F (36.8 ?C) (Oral)   Resp 16   Ht 5\' 7"  (1.702 m)   Wt 74.8 kg   SpO2 100%   BMI 25.84 kg/m?  ?Gen:   Awake, no distress   ?Resp:  Normal effort  ?MSK:   Moves extremities without difficulty  ?Other:  RRR, lung sounds clear ? ?Medical Decision Making  ?Medically screening exam initiated at 3:42 PM.  Appropriate orders placed.  Mynor Hilfiker was informed that the remainder of the evaluation will be completed by another provider, this initial triage assessment does not replace that evaluation, and the importance of remaining in the ED until their evaluation is complete. ? ? ?  ?Rhae Hammock, PA-C ?05/16/21 1543 ? ?

## 2021-05-16 NOTE — ED Triage Notes (Signed)
Patient arrives ambulatory c/o chest pain and headache onset of 5pm yesterday. No radiation. Took aleve last night w/o relief.  ?

## 2021-05-16 NOTE — Discharge Instructions (Signed)
The blood tests and x-rays today were reassuring.  Take the medications as prescribed.  Follow-up with your primary care doctor to be rechecked in the next week.  Return to the ER for any recurrent or worsening symptoms ?

## 2022-05-04 IMAGING — CR DG CHEST 2V
2 series · 2 of 2 positions shown · non-contrast
Comparison: 01/28/2018

CLINICAL DATA: Chest pain and pressure

EXAM:
CHEST - 2 VIEW

[chest pa]
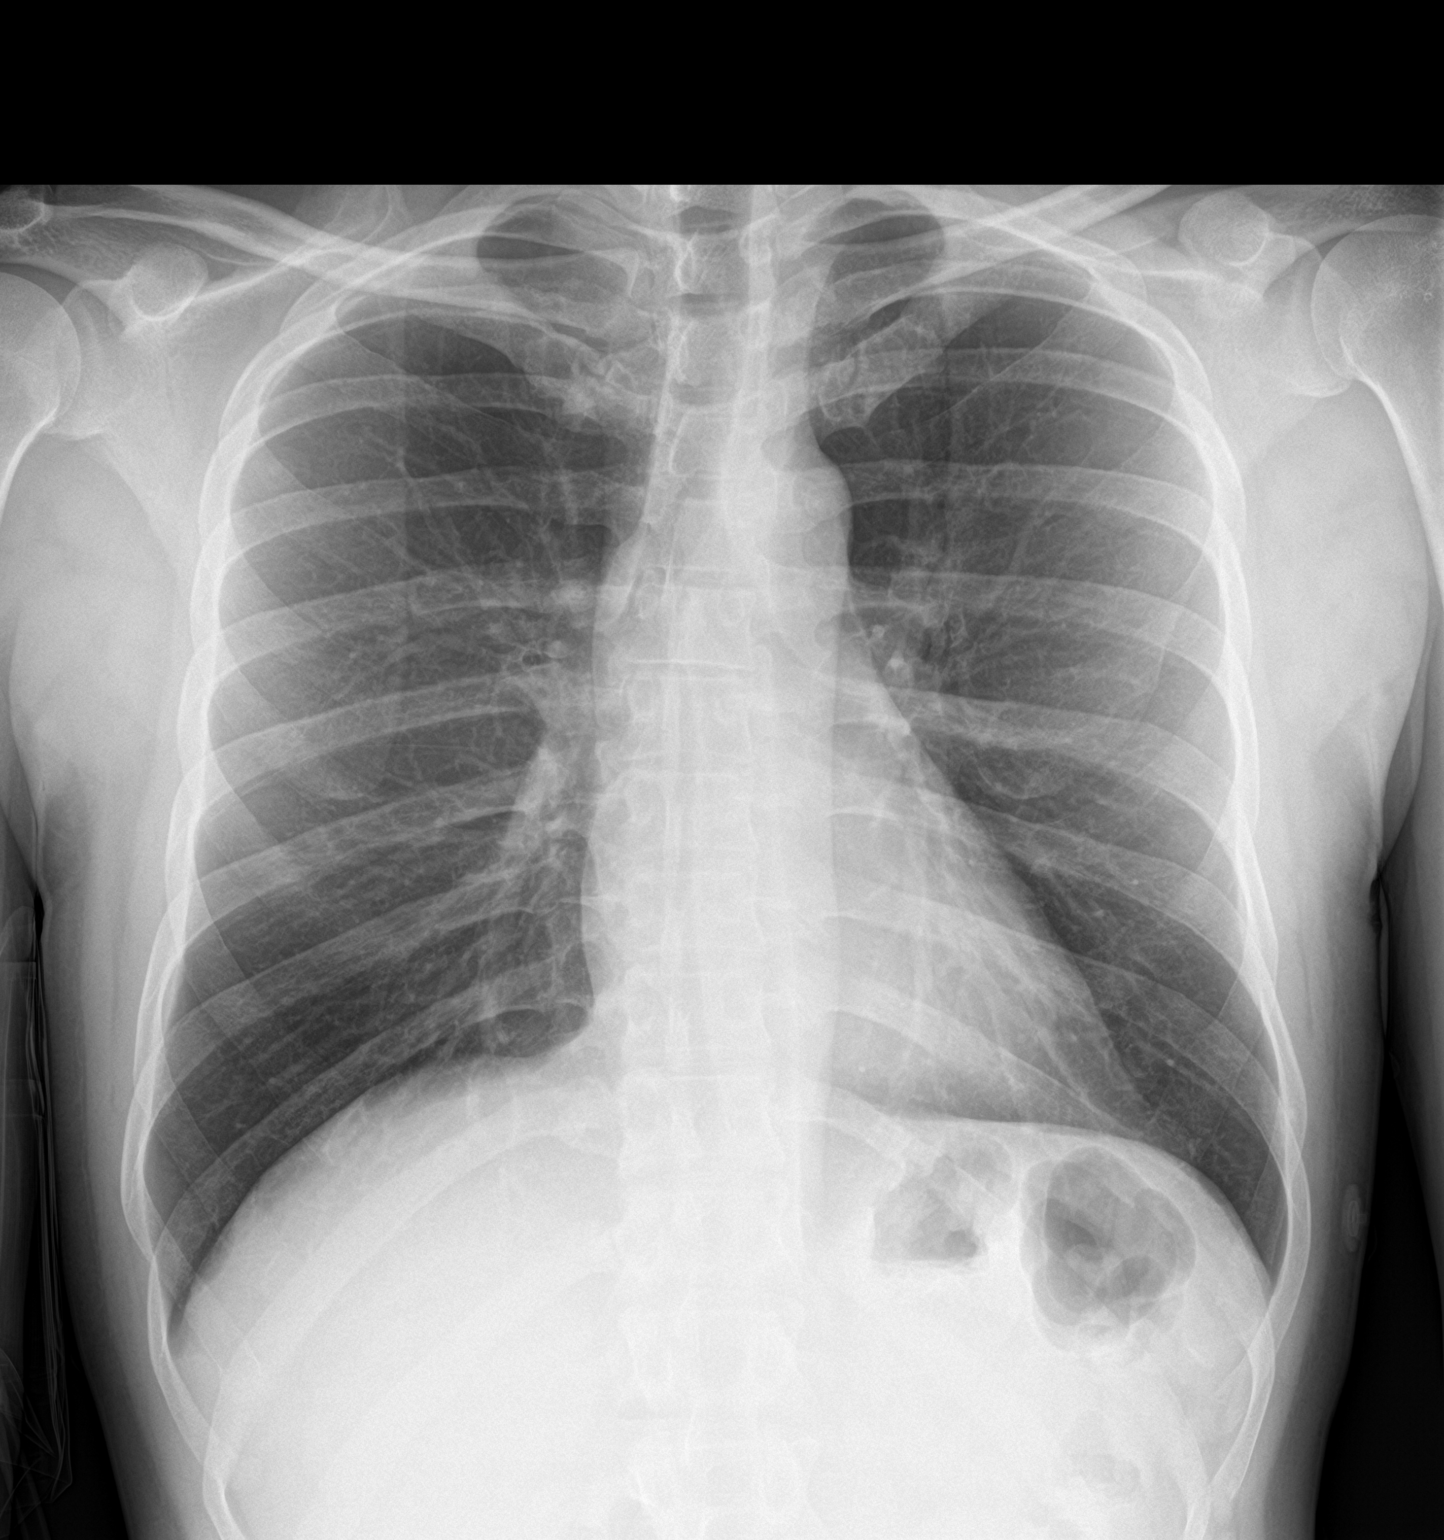

[chest lat]
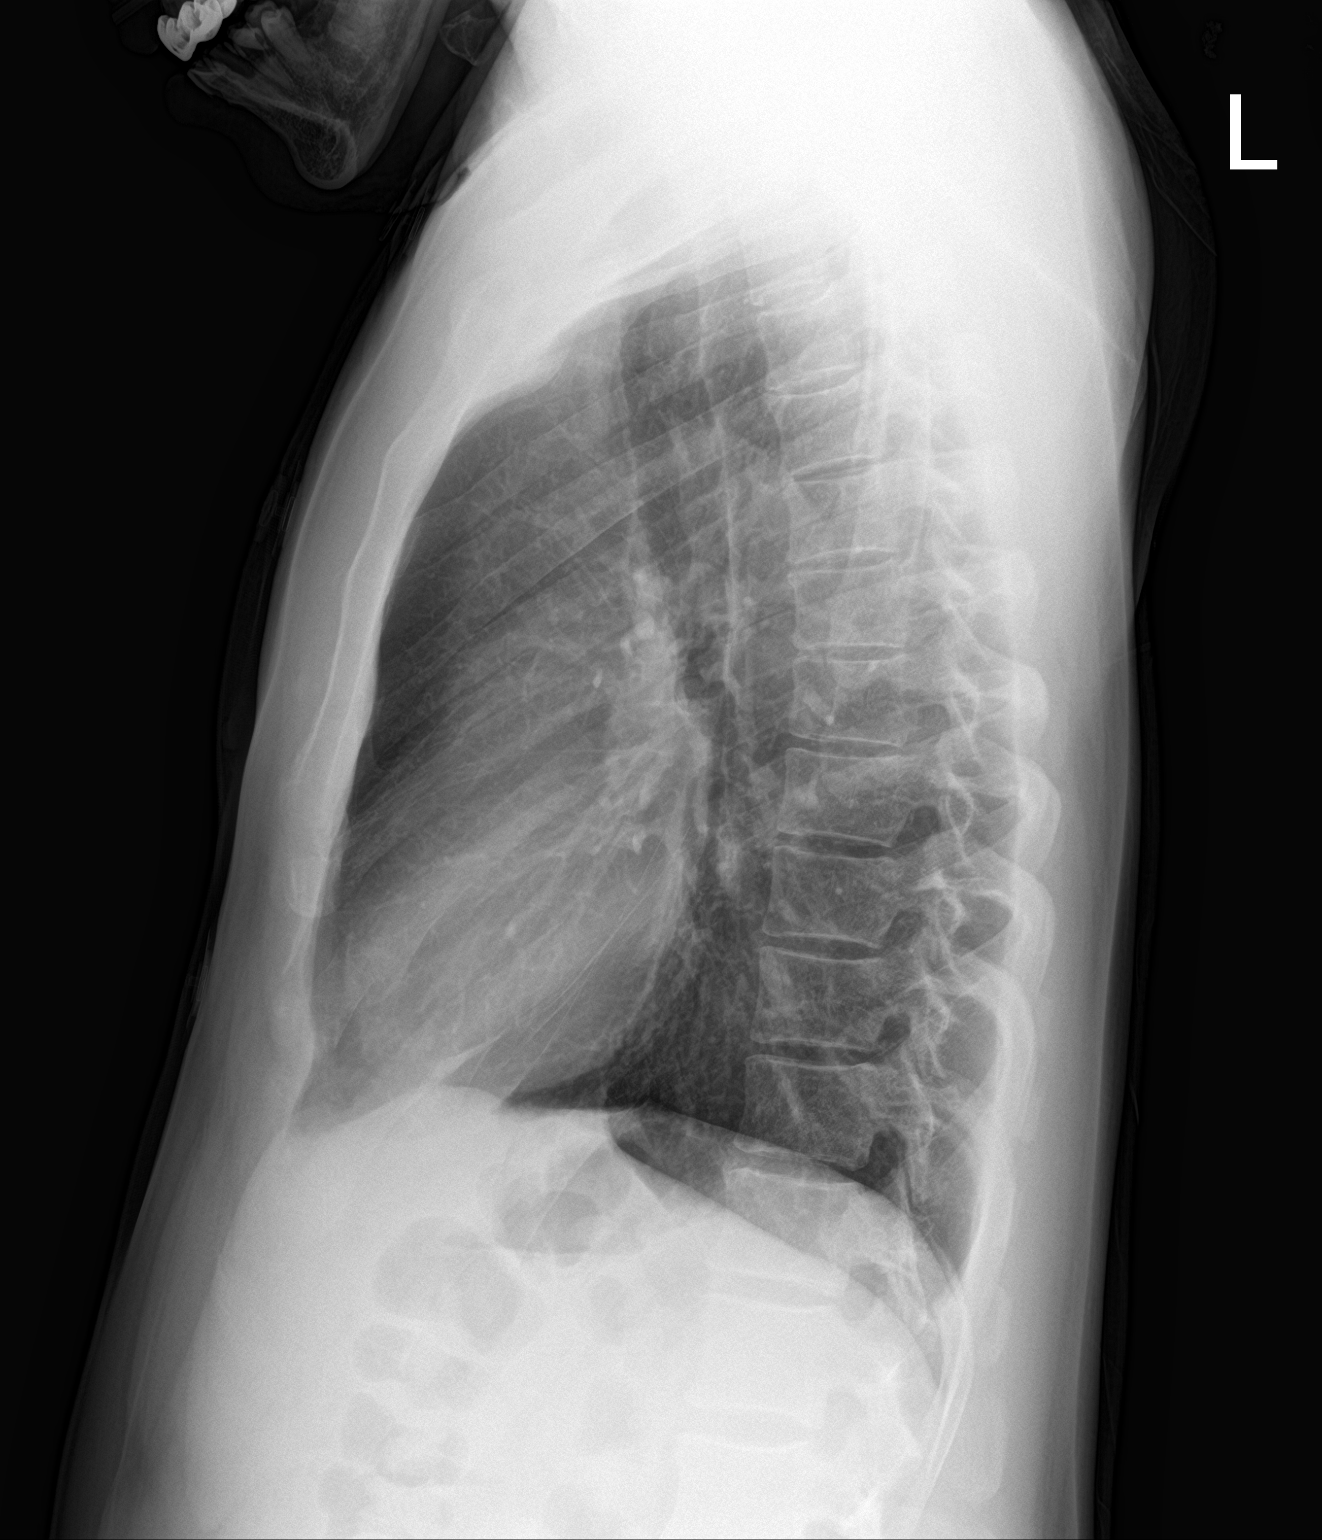

[2 of 2 positions shown; findings below may reference images not displayed]

FINDINGS: The heart size and mediastinal contours are within normal limits.
Both lungs are clear. EKG lead projects over the right upper lobe
laterally. The visualized skeletal structures are unremarkable.
IMPRESSION: No active cardiopulmonary disease.

## 2023-05-28 ENCOUNTER — Emergency Department (HOSPITAL_COMMUNITY)
Admission: EM | Admit: 2023-05-28 | Discharge: 2023-05-28 | Disposition: A | Discharge status: Home / Self Care | Attending: Emergency Medicine | Admitting: Emergency Medicine

## 2023-05-28 ENCOUNTER — Encounter (HOSPITAL_COMMUNITY): Payer: Self-pay

## 2023-05-28 DIAGNOSIS — M545 Low back pain, unspecified: Secondary | ICD-10-CM | POA: Diagnosis present

## 2023-05-28 MED ORDER — LIDOCAINE 5 % EX PTCH: 1.0000 | MEDICATED_PATCH | CUTANEOUS | 0 refills | Status: AC

## 2023-05-28 MED ORDER — NAPROXEN 500 MG PO TABS: 500.0000 mg | ORAL_TABLET | Freq: Two times a day (BID) | ORAL | 0 refills | Status: AC

## 2023-05-28 MED ORDER — HYDROCODONE-ACETAMINOPHEN 5-325 MG PO TABS
1.0000 | ORAL_TABLET | Freq: Once | ORAL | Status: AC
  Administered 2023-05-28: 1 via ORAL
  Filled 2023-05-28: qty 1

## 2023-05-28 MED ORDER — KETOROLAC TROMETHAMINE 15 MG/ML IJ SOLN
15.0000 mg | Freq: Once | INTRAMUSCULAR | Status: AC
  Administered 2023-05-28: 15 mg via INTRAMUSCULAR
  Filled 2023-05-28: qty 1

## 2023-05-28 MED ORDER — LIDOCAINE 5 % EX PTCH
1.0000 | MEDICATED_PATCH | CUTANEOUS | Status: DC
Start: 2023-05-28 — End: 2023-05-28
  Administered 2023-05-28: 1 via TRANSDERMAL
  Filled 2023-05-28: qty 1

## 2023-05-28 MED ORDER — CYCLOBENZAPRINE HCL 10 MG PO TABS
10.0000 mg | ORAL_TABLET | Freq: Two times a day (BID) | ORAL | 0 refills | Status: AC | PRN
Start: 2023-05-28 — End: ?

## 2023-05-28 NOTE — ED Triage Notes (Signed)
 Pt presents with c/o back pain. Pt reports he lifted something heavy last week and his back has been acting up.

## 2023-05-28 NOTE — ED Provider Notes (Signed)
 [place] EMERGENCY DEPARTMENT AT [facility] Provider Note   CSN: [id number] Arrival date & time: 05/28/23  1021     History  Chief Complaint  Patient presents with   Back Pain    Christopher Mcmahon is a 43 y.o. male with medical history of recurrent right shoulder dislocation.  The patient presents to the ED for evaluation of low back pain.  States that he has had back pain "for years".  States that about 1 week ago he bent over to pick up "a small stereo" and he is in pain across his entire low back, right greater than left since this time.  He reports that he went to [facility] where they did x-rays, collected urinalysis and discharged home with ibuprofen  after workup proved to be unremarkable.  He denies any new features to his back pain today.  He denies bowel or bladder incontinence, distal weakness or groin numbness.  He denies fevers, nausea, vomiting, IV drug use.  Reports he has been taking ibuprofen  without relief.  Reports that he was referred to PCP at [facility] but is not able to be seen until June.  Denies any dysuria, fevers.  Denies any recent trauma to account for this pain.   Back Pain      Home Medications Prior to Admission medications   Medication Sig Start Date End Date Taking? Authorizing Provider  cyclobenzaprine (FLEXERIL) 10 MG tablet Take 1 tablet (10 mg total) by mouth 2 (two) times daily as needed for muscle spasms. 05/28/23  Yes Adel Aden, PA-C  lidocaine  (LIDODERM ) 5 % Place 1 patch onto the skin daily. Remove & Discard patch within 12 hours or as directed by MD 05/28/23  Yes Adel Aden, PA-C  naproxen  (NAPROSYN ) 500 MG tablet Take 1 tablet (500 mg total) by mouth 2 (two) times daily. 05/28/23  Yes Adel Aden, PA-C  albuterol  (PROVENTIL  HFA;VENTOLIN  HFA) 108 (90 Base) MCG/ACT inhaler Inhale 2 puffs into the lungs every 4 (four) hours as needed for wheezing or shortness of breath. Patient  not taking: Reported on 02/23/2018 04/15/17   Horton, Vonzella Guernsey, MD  HYDROcodone -acetaminophen  (NORCO/VICODIN) 5-325 MG tablet Take 1 tablet by mouth every 6 (six) hours as needed for severe pain. Patient not taking: Reported on 02/23/2018 07/31/17   Eldon Greenland, MD  ondansetron  (ZOFRAN  ODT) 4 MG disintegrating tablet Take 1 tablet (4 mg total) by mouth every 8 (eight) hours as needed for nausea or vomiting. Patient not taking: Reported on 10/05/2018 02/23/18   Scarlette Currier, MD  ondansetron  (ZOFRAN ) 4 MG tablet Take 1 tablet (4 mg total) by mouth every 6 (six) hours. Patient not taking: Reported on 02/23/2018 01/28/18   Layden, Lindsey A, PA-C  oseltamivir  (TAMIFLU ) 75 MG capsule Take 1 capsule (75 mg total) by mouth every 12 (twelve) hours. Patient not taking: Reported on 10/05/2018 01/28/18   Layden, Lindsey A, PA-C  oxyCODONE  (ROXICODONE ) 5 MG immediate release tablet Take 1 tablet (5 mg total) by mouth every 4 (four) hours as needed for severe pain. Patient not taking: Reported on 10/05/2018 02/23/18   Scarlette Currier, MD      Allergies    Patient has no known allergies.    Review of Systems   Review of Systems  Musculoskeletal:  Positive for back pain.  All other systems reviewed and are negative.   Physical Exam Updated Vital Signs BP 125/76 (BP Location: Right Arm)   Pulse 91   Temp 98.5 F (  36.9 C) (Oral)   Resp 16   SpO2 99%  Physical Exam Vitals and nursing note reviewed.  Constitutional:      General: He is not in acute distress.    Appearance: He is well-developed.  HENT:     Head: Normocephalic and atraumatic.  Eyes:     Conjunctiva/sclera: Conjunctivae normal.  Cardiovascular:     Rate and Rhythm: Normal rate and regular rhythm.     Heart sounds: No murmur heard. Pulmonary:     Effort: Pulmonary effort is normal. No respiratory distress.     Breath sounds: Normal breath sounds.  Abdominal:     Palpations: Abdomen is soft.     Tenderness: There is no abdominal  tenderness.  Musculoskeletal:        General: No swelling.     Cervical back: Neck supple.       Back:  Skin:    General: Skin is warm and dry.     Capillary Refill: Capillary refill takes less than 2 seconds.  Neurological:     Mental Status: He is alert.     GCS: GCS eye subscore is 4. GCS verbal subscore is 5. GCS motor subscore is 6.     Cranial Nerves: Cranial nerves 2-12 are intact. No cranial nerve deficit.     Sensory: Sensation is intact. No sensory deficit.     Motor: Motor function is intact. No weakness.     Comments: 5 out of 5 strength bilateral lower extremities  Psychiatric:        Mood and Affect: Mood normal.     ED Results / Procedures / Treatments   Labs (all labs ordered are listed, but only abnormal results are displayed) Labs Reviewed - No data to display  EKG None  Radiology No results found.  Procedures Procedures    Medications Ordered in ED Medications  HYDROcodone -acetaminophen  (NORCO/VICODIN) 5-325 MG per tablet 1 tablet (has no administration in time range)  ketorolac  (TORADOL ) 15 MG/ML injection 15 mg (has no administration in time range)  lidocaine  (LIDODERM ) 5 % 1 patch (has no administration in time range)    ED Course/ Medical Decision Making/ A&P  Medical Decision Making   43 year old male presents for evaluation.  Please see HPI for further details.  On examination patient is afebrile and nontachycardic.  Lung sounds are clear bilaterally, nonhypoxic.  Abdomen soft and compressible.  Neurological examinations at baseline with 5 out of 5 strength of bilateral lower extremities.  Patient has tenderness throughout his entire low back worse on the right.  No centralized lumbar spinal tenderness.  Chart reviewed.  Patient was seen at [facility] on 4/28, 2 days ago, he received unremarkable x-ray images of his low back as well as a unremarkable urinalysis.  He denies any new features of his back pain today.  Has 5 out  of 5 strength bilateral lower extremities.  No concern for cauda equina necessitating imaging with MRI.  Patient will be referred to orthopedics, provided with muscle relaxers, Salonpas patches and given naproxen  for home.  He was advised to follow-up with orthopedics.  Given strict return precautions and he voiced understanding.  Stable to discharge.   Final Clinical Impression(s) / ED Diagnoses Final diagnoses:  Right-sided low back pain without sciatica, unspecified chronicity    Rx / DC Orders ED Discharge Orders          Ordered    cyclobenzaprine (FLEXERIL) 10 MG tablet  2 times daily PRN  05/28/23 1120    naproxen  (NAPROSYN ) 500 MG tablet  2 times daily        05/28/23 1120    lidocaine  (LIDODERM ) 5 %  Every 24 hours        05/28/23 1120              Adel Aden, PA-C 05/28/23 1122    Arvilla Birmingham, MD 05/28/23 1444

## 2023-05-28 NOTE — Discharge Instructions (Addendum)
 It was a pleasure taking part in your care.  As discussed, I am sending you home with naproxen  which she will take twice a day for pain in your back.  I am also sending you home with Flexeril which is a muscle relaxer which she will take twice a day for pain in your back.  Please do not drive or operate heavy machinery while taking Flexeril.  Please do not drink alcohol with Flexeril.  Please also pick up Lidoderm  patches at your pharmacy.  If these are too expensive with insurance, you may purchase Salonpas patches.  Please read attached guide concerning chronic back pain and managing chronic back pain.  Please follow-up with orthopedic provider Dr. Charol Copas.  Return to the ED with any new or worsening symptoms such as inability to control your bowel or bladder, weakness of your lower legs, groin numbness.

## 2023-06-13 NOTE — Therapy (Incomplete)
 OUTPATIENT PHYSICAL THERAPY THORACOLUMBAR EVALUATION   Patient Name: Christopher Mcmahon MRN: [id number] DOB:Jun 09, 1980, 43 y.o., male Today's Date: 06/13/2023  END OF SESSION:   Past Medical History:  Diagnosis Date   Shoulder dislocation, recurrent, right    No past surgical history on file. There are no active problems to display for this patient.   PCP: [facility], [facility]   REFERRING PROVIDER: Lacinda Pica, MD   REFERRING DIAG:  M54.50 (ICD-10-CM) - Low back pain, unspecified  G89.29 (ICD-10-CM) - Other chronic pain    Rationale for Evaluation and Treatment: Rehabilitation  THERAPY DIAG:  No diagnosis found.  ONSET DATE: ***  SUBJECTIVE:                                                                                                                                                                                           SUBJECTIVE STATEMENT: ***  PERTINENT HISTORY:  Hx recurrent R shoulder dislocation  PAIN:  Are you having pain: *** Location/description: *** Best-worst over past week: ***  - aggravating factors: *** - Easing factors: ***    PRECAUTIONS: {Therapy precautions:24002}  RED FLAGS: {PT Red Flags:29287}   WEIGHT BEARING RESTRICTIONS: No  FALLS:  Has patient fallen in last 6 months? {fallsyesno:27318}  LIVING ENVIRONMENT: Lives with: {[facility] lives with:25569::"lives with their family"} Lives in: {Lives in:25570} Stairs: {opstairs:27293} Has following equipment at home: {Assistive devices:23999}  OCCUPATION: ***  PLOF: {PLOF:24004}  PATIENT GOALS: ***  NEXT MD VISIT: ***  OBJECTIVE:  Note: Objective measures were completed at Evaluation unless otherwise noted.  DIAGNOSTIC FINDINGS:  No recent imaging in chart - per pt, ***   PATIENT SURVEYS:  Modified Oswestry ***   COGNITION: Overall cognitive status: {cognition:24006}    SENSATION/NEURO: Light touch intact all extremities ***  Finger<>nose  testing unremarkable No clonus either LE Negative hoffmann and tromner sign BIL No ataxia with gait No aberrant eye movements or symptoms with saccades/smooth pursuits     LUMBAR ROM:   AROM eval  Flexion   Extension   Right lateral flexion   Left lateral flexion   Right rotation   Left rotation    (Blank rows = not tested) (Key: WFL = within functional limits not formally assessed, * = concordant pain, s = stiffness/stretching sensation, NT = not tested) Comment:   LOWER EXTREMITY ROM:     {AROM/PROM:27142}  Right eval Left eval  Hip flexion    Hip extension    Hip internal rotation    Hip external rotation    Knee extension    Knee flexion    (Blank rows = not tested) (Key:  WFL = within functional limits not formally assessed, * = concordant pain, s = stiffness/stretching sensation, NT = not tested)  Comments:    LOWER EXTREMITY MMT:    MMT Right eval Left eval  Hip flexion    Hip abduction (modified sitting)    Hip internal rotation    Hip external rotation    Knee flexion    Knee extension    Ankle dorsiflexion     (Blank rows = not tested) (Key: WFL = within functional limits not formally assessed, * = concordant pain, s = stiffness/stretching sensation, NT = not tested)  Comments:    LUMBAR SPECIAL TESTS:   Slump test:   R: ***   L: ***   FUNCTIONAL TESTS:  5xSTS/30secSTS: *** TUG: ***   GAIT: Distance walked: within clinic Assistive device utilized: {Assistive devices:23999} Level of assistance: {Levels of assistance:24026} Comments: ***   TREATMENT DATE:  [facility] Adult PT Treatment:                                                DATE: 06/16/23 Therapeutic Exercise: *** Manual Therapy: *** Neuromuscular re-ed: *** Therapeutic Activity: *** Modalities: *** Self Care: ***                                                                                                                                  PATIENT EDUCATION:  Education  details: Pt education on PT impairments, prognosis, and POC. Informed consent. Rationale for interventions, safe/appropriate HEP performance Person educated: Patient Education method: Explanation, Demonstration, Tactile cues, Verbal cues Education comprehension: verbalized understanding, returned demonstration, verbal cues required, tactile cues required, and needs further education    HOME EXERCISE PROGRAM: ***  ASSESSMENT:  CLINICAL IMPRESSION: Patient is a 43 y.o. gentleman who was seen today for physical therapy evaluation and treatment for back pain. ***    OBJECTIVE IMPAIRMENTS: {opptimpairments:25111}.   ACTIVITY LIMITATIONS: {activitylimitations:27494}  PARTICIPATION LIMITATIONS: {participationrestrictions:25113}  PERSONAL FACTORS: {Personal factors:25162} are also affecting patient's functional outcome.   REHAB POTENTIAL: {rehabpotential:25112}  CLINICAL DECISION MAKING: {clinical decision making:25114}  EVALUATION COMPLEXITY: {Evaluation complexity:25115}   GOALS: Goals reviewed with patient? {yes/no:20286}  SHORT TERM GOALS: Target date: ***  Pt will demonstrate appropriate understanding and performance of initially prescribed HEP in order to facilitate improved independence with management of symptoms.  Baseline: HEP ***  Goal status: INITIAL   2. Pt will report at least 25% improvement in overall pain levels over past week in order to facilitate improved tolerance to typical daily activities.   Baseline: ***  Goal status: INITIAL    LONG TERM GOALS: Target date: *** Pt will improve at least 20% on ODI in order to demonstrate improved perception of functional status due to symptoms.  Baseline: *** Goal status: INITIAL  2.  Pt will  demonstrate *** lumbar AROM in order to demonstrate improved tolerance to functional movement patterns.   Baseline: *** Goal status: INITIAL  3.  Pt will demonstrate hip MMT of *** in order to demonstrate improved strength for  functional movements.  Baseline: *** Goal status: INITIAL  4. Pt will perform 5xSTS in <*** sec in order to demonstrate reduced fall risk and improved functional independence. (MCID of 2.3sec)  Baseline: ***  Goal status: INITIAL   PLAN:  PT FREQUENCY: {rehab frequency:25116}  PT DURATION: {rehab duration:25117}  PLANNED INTERVENTIONS: {rehab planned interventions:25118::"97110-Therapeutic exercises","97530- Therapeutic [phone]- Neuromuscular re-education","97535- Self XBMW","41324- Manual therapy"}.  PLAN FOR NEXT SESSION: Review/update HEP PRN. Work on [employer] exercises as appropriate with emphasis on ***. Symptom modification strategies as indicated/appropriate.    Lovett Ruck PT, DPT 06/13/2023 8:15 AM

## 2023-06-16 ENCOUNTER — Ambulatory Visit: Attending: Physical Therapy | Admitting: Physical Therapy
